# Patient Record
Sex: Male | Born: 1960
Health system: Southern US, Community
[De-identification: ages and names within clinical notes are randomized; demographics above are authoritative.]

## PROBLEM LIST (undated history)

## (undated) DIAGNOSIS — M1990 Unspecified osteoarthritis, unspecified site: Secondary | ICD-10-CM

## (undated) DIAGNOSIS — K219 Gastro-esophageal reflux disease without esophagitis: Secondary | ICD-10-CM

## (undated) DIAGNOSIS — K759 Inflammatory liver disease, unspecified: Secondary | ICD-10-CM

## (undated) DIAGNOSIS — E119 Type 2 diabetes mellitus without complications: Secondary | ICD-10-CM

## (undated) DIAGNOSIS — Z87442 Personal history of urinary calculi: Secondary | ICD-10-CM

## (undated) DIAGNOSIS — I499 Cardiac arrhythmia, unspecified: Secondary | ICD-10-CM

## (undated) HISTORY — PX: LITHOTRIPSY: SUR834

## (undated) HISTORY — PX: KIDNEY STONE SURGERY: SHX686

## (undated) HISTORY — PX: HERNIA REPAIR: SHX51

---

## 1998-12-01 ENCOUNTER — Encounter: Admission: RE | Admit: 1998-12-01 | Discharge: 1998-12-29 | Payer: Self-pay | Admitting: Family Medicine

## 2007-09-20 DIAGNOSIS — K759 Inflammatory liver disease, unspecified: Secondary | ICD-10-CM

## 2007-09-20 HISTORY — DX: Inflammatory liver disease, unspecified: K75.9

## 2013-06-04 ENCOUNTER — Other Ambulatory Visit: Payer: Self-pay | Admitting: Urology

## 2013-06-14 NOTE — Patient Instructions (Addendum)
21 Jerome Flores  06/14/2013   Your procedure is scheduled on: 06/28/13  Report to [name] at 5:15 AM.  Call this number if you have problems the morning of surgery [phone]: [phone]   Remember:   Do not eat food or drink liquids After Midnight.     Do not wear jewelry, make-up or nail polish.  Do not wear lotions, powders, or perfumes. You may wear deodorant.  Do not shave 48 hours prior to surgery. Men may shave face and neck.  Do not bring valuables to the hospital.  Contacts, dentures or bridgework may not be worn into surgery.  Leave suitcase in the car. After surgery it may be brought to your room.  For patients admitted to the hospital, checkout time is 11:00 AM the day of discharge.   Birdie Sons, RN  pre op nurse call if needed [phone]    FAILURE TO FOLLOW THESE INSTRUCTIONS MAY RESULT IN CANCELLATION OF YOUR SURGERY   Patient Signature: ___________________________________________

## 2013-06-17 ENCOUNTER — Encounter (HOSPITAL_COMMUNITY): Payer: Self-pay | Admitting: Pharmacy Technician

## 2013-06-17 ENCOUNTER — Encounter (HOSPITAL_COMMUNITY): Payer: Self-pay

## 2013-06-17 ENCOUNTER — Ambulatory Visit (HOSPITAL_COMMUNITY)
Admission: RE | Admit: 2013-06-17 | Discharge: 2013-06-17 | Disposition: A | Discharge status: Home / Self Care | Payer: Commercial Managed Care - PPO | Source: Ambulatory Visit | Attending: Urology | Admitting: Urology

## 2013-06-17 ENCOUNTER — Encounter (HOSPITAL_COMMUNITY)
Admission: RE | Admit: 2013-06-17 | Discharge: 2013-06-17 | Disposition: A | Discharge status: Home / Self Care | Payer: Commercial Managed Care - PPO | Source: Ambulatory Visit | Attending: Urology | Admitting: Urology

## 2013-06-17 DIAGNOSIS — Z01812 Encounter for preprocedural laboratory examination: Secondary | ICD-10-CM | POA: Insufficient documentation

## 2013-06-17 DIAGNOSIS — Z01810 Encounter for preprocedural cardiovascular examination: Secondary | ICD-10-CM | POA: Insufficient documentation

## 2013-06-17 DIAGNOSIS — Z01818 Encounter for other preprocedural examination: Secondary | ICD-10-CM | POA: Insufficient documentation

## 2013-06-17 DIAGNOSIS — E119 Type 2 diabetes mellitus without complications: Secondary | ICD-10-CM | POA: Insufficient documentation

## 2013-06-17 HISTORY — DX: Type 2 diabetes mellitus without complications: E11.9

## 2013-06-17 HISTORY — DX: Personal history of urinary calculi: Z87.442

## 2013-06-17 HISTORY — DX: Gastro-esophageal reflux disease without esophagitis: K21.9

## 2013-06-17 HISTORY — DX: Inflammatory liver disease, unspecified: K75.9

## 2013-06-17 HISTORY — DX: Unspecified osteoarthritis, unspecified site: M19.90

## 2013-06-17 LAB — BASIC METABOLIC PANEL
CO2: 26 mEq/L (ref 19–32)
Chloride: 101 mEq/L (ref 96–112)
GFR calc non Af Amer: >90 mL/min (ref >90)
Glucose, Bld: 106 mg/dL — ABNORMAL HIGH (ref 70–99)
Potassium: 5.1 mEq/L (ref 3.5–5.1)
Sodium: 137 mEq/L (ref 135–145)

## 2013-06-17 LAB — CBC
HCT: 43.8 % (ref 39.0–52.0)
Hemoglobin: 15.3 g/dL (ref 13.0–17.0)
MCH: 33.5 pg (ref 26.0–34.0)
Platelets: 178 10*3/uL (ref 150–400)
RBC: 4.57 MIL/uL (ref 4.22–5.81)

## 2013-06-17 NOTE — Progress Notes (Signed)
06/17/13 4540  OBSTRUCTIVE SLEEP APNEA  Have you ever been diagnosed with sleep apnea through a sleep study? No  Do you snore loudly (loud enough to be heard through closed doors)?  1  Do you often feel tired, fatigued, or sleepy during the daytime? 0  Has anyone observed you stop breathing during your sleep? 1  Do you have, or are you being treated for high blood pressure? 0  BMI more than 35 kg/m2? 0  Age over 51 years old? 1  Neck circumference greater than 40 cm/18 inches? 0  Gender: 1  Obstructive Sleep Apnea Score 4  Score 4 or greater  Results sent to PCP

## 2013-06-27 NOTE — Anesthesia Preprocedure Evaluation (Addendum)
Anesthesia Evaluation  Patient identified by MRN, date of birth, ID band Patient awake    Reviewed: Allergy & Precautions, H&P , NPO status , Patient's Chart, lab work & pertinent test results  Airway Mallampati: II TM Distance: <3 FB Neck ROM: Full    Dental no notable dental hx.    Pulmonary Current Smoker,  breath sounds clear to auscultation  Pulmonary exam normal       Cardiovascular negative cardio ROS  Rhythm:Regular Rate:Normal     Neuro/Psych negative neurological ROS  negative psych ROS   GI/Hepatic Neg liver ROS, GERD-  ,  Endo/Other  diabetes, Oral Hypoglycemic Agents  Renal/GU negative Renal ROS  negative genitourinary   Musculoskeletal negative musculoskeletal ROS (+)   Abdominal   Peds negative pediatric ROS (+)  Hematology negative hematology ROS (+)   Anesthesia Other Findings   Reproductive/Obstetrics negative OB ROS                          Anesthesia Physical Anesthesia Plan  ASA: II  Anesthesia Plan: General   Post-op Pain Management:    Induction: Intravenous  Airway Management Planned: Oral ETT  Additional Equipment:   Intra-op Plan:   Post-operative Plan: Extubation in OR  Informed Consent: I have reviewed the patients History and Physical, chart, labs and discussed the procedure including the risks, benefits and alternatives for the proposed anesthesia with the patient or authorized representative who has indicated his/her understanding and acceptance.   Dental advisory given  Plan Discussed with: CRNA and Surgeon  Anesthesia Plan Comments:         Anesthesia Quick Evaluation

## 2013-06-27 NOTE — Progress Notes (Signed)
Dr. Marlou Porch aware of patient's chest x-ray results per Lossie Faes.

## 2013-06-28 ENCOUNTER — Encounter (HOSPITAL_COMMUNITY): Payer: Self-pay | Admitting: *Deleted

## 2013-06-28 ENCOUNTER — Ambulatory Visit (HOSPITAL_COMMUNITY): Payer: Commercial Managed Care - PPO | Admitting: Anesthesiology

## 2013-06-28 ENCOUNTER — Encounter (HOSPITAL_COMMUNITY)
Admission: RE | Disposition: A | Discharge status: Home / Self Care | Payer: Self-pay | Source: Ambulatory Visit | Attending: Urology

## 2013-06-28 ENCOUNTER — Ambulatory Visit (HOSPITAL_COMMUNITY): Payer: Commercial Managed Care - PPO

## 2013-06-28 ENCOUNTER — Encounter (HOSPITAL_COMMUNITY): Payer: Commercial Managed Care - PPO | Admitting: Anesthesiology

## 2013-06-28 ENCOUNTER — Ambulatory Visit (HOSPITAL_COMMUNITY)
Admission: RE | Admit: 2013-06-28 | Discharge: 2013-06-29 | Disposition: A | Discharge status: Home / Self Care | Payer: Commercial Managed Care - PPO | Source: Ambulatory Visit | Attending: Urology | Admitting: Urology

## 2013-06-28 DIAGNOSIS — G4730 Sleep apnea, unspecified: Secondary | ICD-10-CM | POA: Insufficient documentation

## 2013-06-28 DIAGNOSIS — Z79899 Other long term (current) drug therapy: Secondary | ICD-10-CM | POA: Insufficient documentation

## 2013-06-28 DIAGNOSIS — N200 Calculus of kidney: Secondary | ICD-10-CM | POA: Insufficient documentation

## 2013-06-28 DIAGNOSIS — J4489 Other specified chronic obstructive pulmonary disease: Secondary | ICD-10-CM | POA: Insufficient documentation

## 2013-06-28 DIAGNOSIS — R9389 Abnormal findings on diagnostic imaging of other specified body structures: Secondary | ICD-10-CM | POA: Insufficient documentation

## 2013-06-28 DIAGNOSIS — K769 Liver disease, unspecified: Secondary | ICD-10-CM | POA: Insufficient documentation

## 2013-06-28 DIAGNOSIS — J449 Chronic obstructive pulmonary disease, unspecified: Secondary | ICD-10-CM | POA: Insufficient documentation

## 2013-06-28 DIAGNOSIS — K759 Inflammatory liver disease, unspecified: Secondary | ICD-10-CM | POA: Insufficient documentation

## 2013-06-28 DIAGNOSIS — N201 Calculus of ureter: Secondary | ICD-10-CM | POA: Insufficient documentation

## 2013-06-28 DIAGNOSIS — N135 Crossing vessel and stricture of ureter without hydronephrosis: Secondary | ICD-10-CM | POA: Insufficient documentation

## 2013-06-28 DIAGNOSIS — E7800 Pure hypercholesterolemia, unspecified: Secondary | ICD-10-CM | POA: Insufficient documentation

## 2013-06-28 DIAGNOSIS — E119 Type 2 diabetes mellitus without complications: Secondary | ICD-10-CM | POA: Insufficient documentation

## 2013-06-28 HISTORY — PX: NEPHROLITHOTOMY: SHX5134

## 2013-06-28 LAB — GLUCOSE, CAPILLARY: Glucose-Capillary: 187 mg/dL — ABNORMAL HIGH (ref 70–99)

## 2013-06-28 SURGERY — NEPHROLITHOTOMY PERCUTANEOUS
Anesthesia: General | Laterality: Left | Wound class: Clean Contaminated

## 2013-06-28 MED ORDER — SODIUM CHLORIDE 0.9 % IR SOLN
Status: DC | PRN
  Administered 2013-06-28: 28000 mL

## 2013-06-28 MED ORDER — CIPROFLOXACIN IN D5W 400 MG/200ML IV SOLN
INTRAVENOUS | Status: AC
  Filled 2013-06-28: qty 200

## 2013-06-28 MED ORDER — HYDROMORPHONE HCL PF 1 MG/ML IJ SOLN
INTRAMUSCULAR | Status: AC
  Filled 2013-06-28: qty 1

## 2013-06-28 MED ORDER — BUPIVACAINE-EPINEPHRINE PF 0.25-1:200000 % IJ SOLN
INTRAMUSCULAR | Status: AC
  Filled 2013-06-28: qty 30

## 2013-06-28 MED ORDER — SENNOSIDES-DOCUSATE SODIUM 8.6-50 MG PO TABS
2.0000 | ORAL_TABLET | Freq: Every day | ORAL | Status: DC
  Administered 2013-06-28: 2 via ORAL
  Filled 2013-06-28 (×2): qty 2

## 2013-06-28 MED ORDER — PROPOFOL 10 MG/ML IV BOLUS
INTRAVENOUS | Status: DC | PRN
  Administered 2013-06-28: 200 mg via INTRAVENOUS

## 2013-06-28 MED ORDER — CEFAZOLIN SODIUM-DEXTROSE 2-3 GM-% IV SOLR
2.0000 g | Freq: Once | INTRAVENOUS | Status: AC
  Administered 2013-06-28: 2 g via INTRAVENOUS
  Filled 2013-06-28: qty 50

## 2013-06-28 MED ORDER — ONDANSETRON HCL 4 MG PO TABS: 4.0000 mg | ORAL_TABLET | Freq: Three times a day (TID) | ORAL | Status: DC | PRN

## 2013-06-28 MED ORDER — ROCURONIUM BROMIDE 100 MG/10ML IV SOLN
INTRAVENOUS | Status: DC | PRN
  Administered 2013-06-28 (×4): 10 mg via INTRAVENOUS
  Administered 2013-06-28: 30 mg via INTRAVENOUS
  Administered 2013-06-28: 10 mg via INTRAVENOUS

## 2013-06-28 MED ORDER — BACITRACIN-NEOMYCIN-POLYMYXIN 400-5-5000 EX OINT: 1.0000 "application " | TOPICAL_OINTMENT | Freq: Three times a day (TID) | CUTANEOUS | Status: DC | PRN

## 2013-06-28 MED ORDER — LIDOCAINE HCL (PF) 2 % IJ SOLN
INTRAMUSCULAR | Status: DC | PRN
  Administered 2013-06-28: 40 mg

## 2013-06-28 MED ORDER — DIPHENHYDRAMINE HCL 12.5 MG/5ML PO ELIX: 12.5000 mg | ORAL_SOLUTION | Freq: Four times a day (QID) | ORAL | Status: DC | PRN

## 2013-06-28 MED ORDER — SODIUM CHLORIDE 0.9 % IV SOLN: 250.0000 mL | INTRAVENOUS | Status: DC | PRN

## 2013-06-28 MED ORDER — SODIUM CHLORIDE 0.9 % IJ SOLN: 3.0000 mL | INTRAMUSCULAR | Status: DC | PRN

## 2013-06-28 MED ORDER — CIPROFLOXACIN IN D5W 400 MG/200ML IV SOLN
400.0000 mg | INTRAVENOUS | Status: AC
  Administered 2013-06-28: 400 mg via INTRAVENOUS

## 2013-06-28 MED ORDER — PROMETHAZINE HCL 25 MG/ML IJ SOLN
INTRAMUSCULAR | Status: AC
  Filled 2013-06-28: qty 1

## 2013-06-28 MED ORDER — HYDROMORPHONE HCL PF 1 MG/ML IJ SOLN
0.2500 mg | INTRAMUSCULAR | Status: DC | PRN
  Administered 2013-06-28 (×2): 0.5 mg via INTRAVENOUS

## 2013-06-28 MED ORDER — NEOSTIGMINE METHYLSULFATE 1 MG/ML IJ SOLN
INTRAMUSCULAR | Status: DC | PRN
  Administered 2013-06-28: 4 mg via INTRAVENOUS

## 2013-06-28 MED ORDER — SUCCINYLCHOLINE CHLORIDE 20 MG/ML IJ SOLN
INTRAMUSCULAR | Status: DC | PRN
  Administered 2013-06-28: 100 mg via INTRAVENOUS

## 2013-06-28 MED ORDER — SODIUM CHLORIDE 0.9 % IJ SOLN: 3.0000 mL | Freq: Two times a day (BID) | INTRAMUSCULAR | Status: DC

## 2013-06-28 MED ORDER — ADULT MULTIVITAMIN W/MINERALS CH
1.0000 | ORAL_TABLET | Freq: Every day | ORAL | Status: DC
  Administered 2013-06-28 – 2013-06-29 (×2): 1 via ORAL
  Filled 2013-06-28 (×2): qty 1

## 2013-06-28 MED ORDER — LACTATED RINGERS IV SOLN
INTRAVENOUS | Status: DC | PRN
  Administered 2013-06-28 (×2): via INTRAVENOUS

## 2013-06-28 MED ORDER — DIPHENHYDRAMINE HCL 50 MG/ML IJ SOLN: 12.5000 mg | Freq: Four times a day (QID) | INTRAMUSCULAR | Status: DC | PRN

## 2013-06-28 MED ORDER — BUPIVACAINE-EPINEPHRINE PF 0.25-1:200000 % IJ SOLN
INTRAMUSCULAR | Status: DC | PRN
  Administered 2013-06-28: 10 mL

## 2013-06-28 MED ORDER — CEFAZOLIN SODIUM-DEXTROSE 2-3 GM-% IV SOLR
INTRAVENOUS | Status: AC
  Filled 2013-06-28: qty 50

## 2013-06-28 MED ORDER — MORPHINE SULFATE 2 MG/ML IJ SOLN
2.0000 mg | INTRAMUSCULAR | Status: DC | PRN
  Administered 2013-06-29: 2 mg via INTRAVENOUS
  Filled 2013-06-28: qty 1

## 2013-06-28 MED ORDER — PROMETHAZINE HCL 25 MG/ML IJ SOLN
6.2500 mg | INTRAMUSCULAR | Status: DC | PRN
  Administered 2013-06-28: 6.25 mg via INTRAVENOUS

## 2013-06-28 MED ORDER — MIDAZOLAM HCL 5 MG/5ML IJ SOLN
INTRAMUSCULAR | Status: DC | PRN
  Administered 2013-06-28: 2 mg via INTRAVENOUS

## 2013-06-28 MED ORDER — GLIPIZIDE 5 MG PO TABS
5.0000 mg | ORAL_TABLET | Freq: Two times a day (BID) | ORAL | Status: DC
  Administered 2013-06-28 – 2013-06-29 (×2): 5 mg via ORAL
  Filled 2013-06-28 (×4): qty 1

## 2013-06-28 MED ORDER — GLYCOPYRROLATE 0.2 MG/ML IJ SOLN
INTRAMUSCULAR | Status: DC | PRN
  Administered 2013-06-28: 0.6 mg via INTRAVENOUS

## 2013-06-28 MED ORDER — IOHEXOL 300 MG/ML  SOLN
INTRAMUSCULAR | Status: DC | PRN
  Administered 2013-06-28: 125 mL via URETHRAL

## 2013-06-28 MED ORDER — METFORMIN HCL 500 MG PO TABS
1000.0000 mg | ORAL_TABLET | Freq: Two times a day (BID) | ORAL | Status: DC
  Administered 2013-06-28 – 2013-06-29 (×2): 1000 mg via ORAL
  Filled 2013-06-28 (×4): qty 2

## 2013-06-28 MED ORDER — CIPROFLOXACIN HCL 500 MG PO TABS
500.0000 mg | ORAL_TABLET | Freq: Once | ORAL | Status: AC
  Administered 2013-06-28: 20:00:00 500 mg via ORAL
  Filled 2013-06-28: qty 1

## 2013-06-28 MED ORDER — FENTANYL CITRATE 0.05 MG/ML IJ SOLN
INTRAMUSCULAR | Status: DC | PRN
  Administered 2013-06-28 (×2): 50 ug via INTRAVENOUS
  Administered 2013-06-28: 100 ug via INTRAVENOUS
  Administered 2013-06-28 (×3): 50 ug via INTRAVENOUS

## 2013-06-28 MED ORDER — CEFAZOLIN SODIUM-DEXTROSE 2-3 GM-% IV SOLR
2.0000 g | INTRAVENOUS | Status: AC
  Administered 2013-06-28: 2 g via INTRAVENOUS

## 2013-06-28 MED ORDER — GLIPIZIDE-METFORMIN HCL 2.5-500 MG PO TABS: 2.0000 | ORAL_TABLET | Freq: Two times a day (BID) | ORAL | Status: DC

## 2013-06-28 MED ORDER — SODIUM CHLORIDE 0.9 % IV SOLN
INTRAVENOUS | Status: DC
  Administered 2013-06-28 – 2013-06-29 (×3): via INTRAVENOUS

## 2013-06-28 MED ORDER — LACTATED RINGERS IV SOLN: INTRAVENOUS | Status: DC

## 2013-06-28 MED ORDER — ONDANSETRON HCL 4 MG/2ML IJ SOLN
INTRAMUSCULAR | Status: DC | PRN
  Administered 2013-06-28: 4 mg via INTRAMUSCULAR

## 2013-06-28 MED ORDER — OXYCODONE HCL 5 MG PO TABS
5.0000 mg | ORAL_TABLET | ORAL | Status: DC | PRN
  Administered 2013-06-28 (×2): 5 mg via ORAL
  Administered 2013-06-29: 10 mg via ORAL
  Filled 2013-06-28: qty 2
  Filled 2013-06-28 (×2): qty 1

## 2013-06-28 MED ORDER — ACETAMINOPHEN 500 MG PO TABS
1000.0000 mg | ORAL_TABLET | Freq: Four times a day (QID) | ORAL | Status: DC | PRN
  Filled 2013-06-28: qty 2

## 2013-06-28 MED ORDER — OXYCODONE HCL 5 MG PO TABS: 5.0000 mg | ORAL_TABLET | ORAL | Status: DC | PRN

## 2013-06-28 SURGICAL SUPPLY — 60 items
APL SKNCLS STERI-STRIP NONHPOA (GAUZE/BANDAGES/DRESSINGS) ×1
BAG URINE DRAINAGE (UROLOGICAL SUPPLIES) ×4 IMPLANT
BASKET ZERO TIP NITINOL 2.4FR (BASKET) ×3 IMPLANT
BENZOIN TINCTURE PRP APPL 2/3 (GAUZE/BANDAGES/DRESSINGS) ×3 IMPLANT
BLADE SURG 15 STRL LF DISP TIS (BLADE) ×1 IMPLANT
BLADE SURG 15 STRL SS (BLADE) ×2
BSKT STON RTRVL ZERO TP 2.4FR (BASKET) ×2
CATCHER STONE W/TUBE ADAPTER (UROLOGICAL SUPPLIES) ×1 IMPLANT
CATH AINSWORTH 30CC 24FR (CATHETERS) ×2 IMPLANT
CATH BEACON 5.038 65CM KMP-01 (CATHETERS) ×2 IMPLANT
CATH FOLEY 2W COUNCIL 20FR 5CC (CATHETERS) IMPLANT
CATH FOLEY 2WAY SLVR  5CC 16FR (CATHETERS) ×1
CATH FOLEY 2WAY SLVR 5CC 16FR (CATHETERS) ×1 IMPLANT
CATH INTERMIT  6FR 70CM (CATHETERS) ×1 IMPLANT
CATH ROBINSON RED A/P 20FR (CATHETERS) IMPLANT
CATH URET 5FR 28IN OPEN ENDED (CATHETERS) ×2 IMPLANT
CATH X-FORCE N30 NEPHROSTOMY (TUBING) ×2 IMPLANT
CHLORAPREP W/TINT 26ML (MISCELLANEOUS) ×3 IMPLANT
CLOTH BEACON ORANGE TIMEOUT ST (SAFETY) ×1 IMPLANT
COVER SURGICAL LIGHT HANDLE (MISCELLANEOUS) ×2 IMPLANT
DRAPE C-ARM 42X120 X-RAY (DRAPES) ×2 IMPLANT
DRAPE CAMERA CLOSED 9X96 (DRAPES) ×4 IMPLANT
DRAPE LG THREE QUARTER DISP (DRAPES) ×2 IMPLANT
DRAPE LINGEMAN PERC (DRAPES) ×2 IMPLANT
DRAPE SURG IRRIG POUCH 19X23 (DRAPES) ×2 IMPLANT
DRSG TEGADERM 8X12 (GAUZE/BANDAGES/DRESSINGS) ×4 IMPLANT
FIBER LASER FLEXIVA 365 (UROLOGICAL SUPPLIES) IMPLANT
FIBER LASER FLEXIVA 550 (UROLOGICAL SUPPLIES) IMPLANT
GLOVE BIOGEL M STRL SZ7.5 (GLOVE) ×8 IMPLANT
GOWN STRL REIN XL XLG (GOWN DISPOSABLE) ×5 IMPLANT
GUIDEWIRE AMPLAZ .035X145 (WIRE) ×4 IMPLANT
GUIDEWIRE ANG ZIPWIRE 038X150 (WIRE) ×3 IMPLANT
GUIDEWIRE STR DUAL SENSOR (WIRE) ×1 IMPLANT
KIT BASIN OR (CUSTOM PROCEDURE TRAY) ×2 IMPLANT
MANIFOLD NEPTUNE II (INSTRUMENTS) ×2 IMPLANT
NDL TROCAR 18X15 ECHO (NEEDLE) IMPLANT
NDL TROCAR 18X20 (NEEDLE) IMPLANT
NEEDLE TROCAR 18X15 ECHO (NEEDLE) IMPLANT
NEEDLE TROCAR 18X20 (NEEDLE) ×2 IMPLANT
NS IRRIG 1000ML POUR BTL (IV SOLUTION) ×2 IMPLANT
PACK BASIC VI WITH GOWN DISP (CUSTOM PROCEDURE TRAY) ×2 IMPLANT
PACK CYSTO (CUSTOM PROCEDURE TRAY) ×2 IMPLANT
PAD ABD 7.5X8 STRL (GAUZE/BANDAGES/DRESSINGS) ×4 IMPLANT
PROBE LITHOCLAST ULTRA 3.8X403 (UROLOGICAL SUPPLIES) ×1 IMPLANT
PROBE PNEUMATIC 1.0MMX570MM (UROLOGICAL SUPPLIES) ×2 IMPLANT
SET IRRIG Y TYPE TUR BLADDER L (SET/KITS/TRAYS/PACK) ×2 IMPLANT
SET WARMING FLUID IRRIGATION (MISCELLANEOUS) ×1 IMPLANT
SHEATH PEELAWAY SET 9 (SHEATH) ×1 IMPLANT
SPONGE GAUZE 4X4 12PLY (GAUZE/BANDAGES/DRESSINGS) ×2 IMPLANT
SPONGE LAP 4X18 X RAY DECT (DISPOSABLE) ×2 IMPLANT
STENT CONTOUR 6FRX28X.038 (STENTS) IMPLANT
STENT CONTOUR 7FR X 28 (STENTS) ×1 IMPLANT
STONE CATCHER W/TUBE ADAPTER (UROLOGICAL SUPPLIES) ×2 IMPLANT
SUT SILK 0 FSL (SUTURE) ×2 IMPLANT
SYR 20CC LL (SYRINGE) ×4 IMPLANT
SYRINGE 10CC LL (SYRINGE) ×2 IMPLANT
SYRINGE IRR TOOMEY STRL 70CC (SYRINGE) ×1 IMPLANT
TOWEL OR 17X26 10 PK STRL BLUE (TOWEL DISPOSABLE) ×2 IMPLANT
TUBING CONNECTING 10 (TUBING) ×4 IMPLANT
WATER STERILE IRR 1500ML POUR (IV SOLUTION) ×2 IMPLANT

## 2013-06-28 NOTE — Transfer of Care (Signed)
Immediate Anesthesia Transfer of Care Note  Patient: Jerome Flores  Procedure(s) Performed: Procedure(s) (LRB): NEPHROLITHOTOMY LEFT PERCUTANEOUS (Left)  Patient Location: PACU  Anesthesia Type: General  Level of Consciousness: sedated, patient cooperative and responds to stimulation  Airway & Oxygen Therapy: Patient Spontanous Breathing and Patient connected to face mask oxgen  Post-op Assessment: Report given to PACU RN and Post -op Vital signs reviewed and stable  Post vital signs: Reviewed and stable  Complications: No apparent anesthesia complications

## 2013-06-28 NOTE — Anesthesia Postprocedure Evaluation (Signed)
  Anesthesia Post-op Note  Patient: Jerome Flores  Procedure(s) Performed: Procedure(s) (LRB): NEPHROLITHOTOMY LEFT PERCUTANEOUS (Left)  Patient Location: PACU  Anesthesia Type: General  Level of Consciousness: awake and alert   Airway and Oxygen Therapy: Patient Spontanous Breathing  Post-op Pain: mild  Post-op Assessment: Post-op Vital signs reviewed, Patient's Cardiovascular Status Stable, Respiratory Function Stable, Patent Airway and No signs of Nausea or vomiting  Last Vitals:  Filed Vitals:   06/28/13 0533  BP: 120/68  Pulse: 77  Temp: 36.8 C  Resp: 18    Post-op Vital Signs: stable   Complications: No apparent anesthesia complications

## 2013-06-28 NOTE — Preoperative (Signed)
Beta Blockers   Reason not to administer Beta Blockers:Not Applicable 

## 2013-06-28 NOTE — Op Note (Signed)
Pre-operative diagnosis: left renal pelvis stones >2.0  Post-operative diagnosis: as above   Procedure performed: cystoscopy, left retrograde pyelogram with interpretation, left percutaneous renal access, left nephrolithotomy, left nephrostogram, left ureteral stent placement, left nephrostomy tube placement.   Surgeon: Dr. Crist Fat  Assistant: none  Anesthesia: General  Complications: None  Findings: 1. Large obstructing/impacted stone in proximal left ureter. 2. I injected contrast into the left proximal ureter and collecting system and performed a retrograde pyelogram. There was significant hydronephrosis proximal to the impacted stone which was noted by a filling defect within the proximal ureter.  3. Distal ureter stenotic from previous open ureterolithotomy  4. Post-procedure nephrostogram revealed no further filling defects and correct position of the tube.   EBL: Minimal  Specimens: stone from collecting system - taken to [facility] lab  Indication: Jerome Flores is a 52 y.o. patient with long history of stones, with large left sided stone burden, including left proximal partially obstructing/impacted ureteral stone. After reviewing the management options for treatment, he elected to proceed with the above surgical procedure(s). We have discussed the potential benefits and risks of the procedure, side effects of the proposed treatment, the likelihood of the patient achieving the goals of the procedure, and any potential problems that might occur during the procedure or recuperation. Informed consent has been obtained.   Description:  Consent was obtained in the preoperative holding area. The patient was then taken to the operating room he was intubated on the gurney and then flipped prone onto the split leg operating room table. 2 large jelly rolls were used to pack is shoulders and iliac crests bilaterally. He was then prepped and draped in the  routine sterile fashion same Betadine prep for the genitals and chlorhexidine for the left back. We then started with a flexible cystoscope and inserted into the patient's urethra and into the bladder under visual guidance. I then placed a 0.38 sensor wire into the patient's left ureteral orifice and into the left renal pelvis. This was somewhat difficult given the impacted stone but ultimately we were able to push beyond it. We then passed the 5 [place] open-ended ureteral catheter over the sensor wire and into the left renal pelvis. We then injected contrast and performed a retrograde pyelogram. There was significant hydronephrosis proximal to the impacted stone which was noted by a filling defect within the proximal ureter.   Then using the C-arm rotated at 25 and the bulls-eye technique with an 18-gauge coaxial needle the lower pole calyx was targeted. Then rotating the C-arm AP  the depth of our needle was noted to be within the calyx and the inner part of the coaxial needle was removed. Urine was noted to return. A curved tip Glidewire was then passed through the sheath of the coaxial needle and into the  renal collecting system. The wire was then  passed beyond the impacted proximal ureteral stone and into the bladder where it was coiled in the bladder  and the sheath of the needle was removed. An angiographic catheter was then passed over the wire  into the bladder. Once in the bladder  with the angiographic catheter wire  was removed. A Super Stiff wire was then passed into the angiographic catheter and the angiographic catheter removed. A 9 French dilator was then advanced over the Super Stiff wire and passed into the renal pelvis  across the UPJ. The inner part of this dilator was removed and the angled Glidewire was again passed  alongside the Super Stiff wire through the outer part of the dilator into the right ureter. The angiographic catheter again was passed  over theGlidewire and used to help guide  the wire into the bladder. Once the wire was in the bladder the angiographic catheter was advanced into the bladder and the wire was removed. A second Super Stiff wire was then passed to the angiographic catheter and angiographic catheter removed. The outer part of the sheath was then removed, establishing 2 superstiff wires through the lower pole calyx and into the bladder.   The 30 French NephroMax balloon was then passed over one of the Super Stiff wires and the tip guided down into the right lower pole calyx. The balloon was then inflated to approximately 10 atm, and once there was no waist noted under fluoroscopy the access sheath was advanced over the balloon. The balloon was then removed. The  safety wire was  then backloaded into the sheath and snapped to the drape.   Using the rigid nephroscope confirmed position within the lower pole of the  left  collecting system. Several large stones were identified and removed with the 2 prong grasper. Then using a flexible cystoscope to navigate the remaining calyces of the kidney and multiple smaller stone fragments encountered. Using the 0 tip basket these fragments were grabbed and removed. Contrast was injected through the cystoscope and the calyces systematically inspected under fluoroscopic guidance to ensure that all stone fragments had been removed.   Then using the flexible cystoscope the ureter was navigated in antegrade fashion. The large impacted stone was encountered in the proximal ureter. The stone was manipulated and turned using the basket and then ultimately I was able to remove the stone with the basket not having to use the laser.  All additional stone fragments were removed from the ureter using a 0 tipped basket. Once the ureter was clear the scope was advanced into the bladder with significant resistance secondary to the stricture  and a sensor wire was left in the bladder and the scope backed out over wire. The sensor wire was then  backloaded over the rigid nephroscope using the stent pusher and a 2 8 cm x  7  French double-J ureteral stent was passed over the sensor wire down into the bladder under fluoroscopic guidance. Once the stent was in the bladder the wire was gently pulled back and a nice curl noted in the bladder. The string was then removed from the stent and the wire completely removed from the collecting system. A nice curl on the proximal end of the stent was noted in the renal pelvis. The sheath was then backed out slowly to ensure that all calyces had been inspected and there was nothing behind the sheath.   A 24  [place] Ainsworth catheter was then passed over one of the Super Stiff wires through the sheath and into the renal pelvis. A 5 French open-ended ureteral  catheter  passed over the Super Stiff wire through the council tip catheter and down the ureter. The sheath was then backed out of the kidney and cut off the Nephrostomy  catheter. A nephrostogram was then performed confirming the position of our nephrostomy tube and reassuring that there were no longer any filling defects from the patient's symptoms. The tube was then secured to the patient's skin with 0 silk ties. 10 cc of local anesthesia was then injected into the patient's wound,  A. catheter bag was then attached and the tube was  padded and dressed. The patient was subsequently rolled over to the supine position and extubated. He was returned to the PACU in excellent condition. At the end of the case all lap and needle and sponges were accounted for. There are no perioperative complications.

## 2013-06-28 NOTE — H&P (Signed)
Active Problems Problems  1. Nephrolithiasis 592.0  History of Present Illness  Patient is referred by Dr. Jenita Seashore, M.D. for evaluation of hematuria.  52 year old gentleman who recently underwent a physical [employer] and found microscopic hematuria.  Patient denies any gross hematuria, lower urinary tract symptoms, abdominal pain/suprapubic pain or back pain.  There is a history of kidney stones, he has had stones at least 3 times.  The last time the patient underwent ureteroscopy with stent insertion in September 2012.  Patient states that in 2009 he had an open ureterolithotomy for a Steinstrasse resulting from shockwave lithotripsy.  He has not had any follow-up or imaging and 3 years and was told that he had 6 stones remaining in his left kidney.  He is interested in routine care of the stones being stone free.  Patient works as a [employer] architect.  Patient is a former smoker, smokes 1 cigar per day.   Past Medical History Problems  1. History of  Adult Sleep Apnea 780.57 2. History of  Arthritis V13.4 3. History of  Chronic Liver Disease 571.9 4. History of  Diabetes Mellitus 250.00 5. History of  Hepatitis 573.3 6. History of  Hypercholesterolemia 272.0  Surgical History Problems  1. History of  Lithotripsy 2. History of  Lithotripsy  Current Meds 1. Actos 15 MG Oral Tablet; Therapy: (Recorded:15Sep2014) to 2. GlipiZIDE-MetFORMIN HCl 2.5-500 MG Oral Tablet; Therapy: (Recorded:15Sep2014) to 3. Simvastatin 40 MG Oral Tablet; Therapy: (Recorded:15Sep2014) to  Family History Problems  1. Family history of  Acute Myocardial Infarction V17.3 2. Family history of  Death In The Family Father age 39 3. Family history of  Diabetes Mellitus V18.0 4. Family history of  Family Health Status Number Of Children 1 son 1 daughter  Social History Problems    Caffeine Use   Marital History - Currently Married   Occupation: truck Hospital doctor   Tobacco Use  161.0 smoked for 30 years Denied    History of  Alcohol Use  Review of Systems Genitourinary, constitutional, skin, eye, otolaryngeal, hematologic/lymphatic, cardiovascular, pulmonary, endocrine, musculoskeletal, gastrointestinal, neurological and psychiatric system(s) were reviewed and pertinent findings if present are noted.  Eyes: blurred vision.    Vitals Vital Signs [Data Includes: Last 1 Day]  15Sep2014 08:58AM  BMI Calculated: 29.69 BSA Calculated: 2.25 Height: 6 ft 1 in Weight: 224 lb  Blood Pressure: 128 / 76 Temperature: 99 F Heart Rate: 76  Physical Exam Constitutional: Well nourished and well developed . No acute distress.  ENT:. The ears and nose are normal in appearance.  Neck: The appearance of the neck is normal and no neck mass is present.  Pulmonary: No respiratory distress and normal respiratory rhythm and effort.  Neuro/Psych:. Mood and affect are appropriate.    Results/Data Urine [Data Includes: Last 1 Day]   15Sep2014  COLOR YELLOW   APPEARANCE CLEAR   SPECIFIC GRAVITY 1.010   pH 6.0   GLUCOSE NEG mg/dL  BILIRUBIN NEG   KETONE NEG mg/dL  BLOOD SMALL   PROTEIN NEG mg/dL  UROBILINOGEN 0.2 mg/dL  NITRITE NEG   LEUKOCYTE ESTERASE NEG   SQUAMOUS EPITHELIAL/HPF NONE SEEN   WBC 0-2 WBC/hpf  RBC 3-6 RBC/hpf  BACTERIA NONE SEEN   CRYSTALS Calcium Oxalate crystals noted   CASTS NONE SEEN    Procedure  Stone protocol CT scan was obtained on 06/03/2013: Independently reviewed the films which show a 1 cm stone in the UPJ of the left side with prominent extrarenal pelvis versus  mild hydronephrosis, and addition there are multiple stones in the lower pole of the left kidney the largest measuring approximately 8 mm. There are no stones within the ureter or the bladder.   Assessment  Left 1 cm UPJ stone likely partially obstructing with proximal hydro-, multiple smaller within the left lower pole.   Plan Health Maintenance (V70.0)  1. UA With REFLEX   Done: 15Sep2014 08:51AM Nephrolithiasis (592.0)  2. Follow-up Schedule Surgery Office  Follow-up  Requested for: 15Sep2014   I went over the treatment options for this size stone burden in detail with the patient and his wife.  In particular we discussed the role of shockwave lithotripsy for the UPJ stone followed by ureteroscopy for the small fragments.  We also discussed ureteroscopy, which would likely be a staged procedure and required at least 2 anesthesias.  Lastly we discussed PCNL.  I went over the risks and benefits of all these procedures in great detail.  Given the amount of stone and the patients desire to be stone free I counseled him that the best shot at achieving a stone free status with one anesthesia is with PCNL.  I went over the risks and benefits of this operation in detail.  The patient understands the risk of bleeding, infection, and potential loss of kidney or need for embolization.  He also understands the nature of the procedure and that we may need to do a second look.  He also understands that he will be in the hospital for 1 day and likely have a stent following this.  The patient will discuss all these options with his wife and will call to schedule his surgery.

## 2013-06-29 ENCOUNTER — Ambulatory Visit (HOSPITAL_COMMUNITY): Payer: Commercial Managed Care - PPO

## 2013-06-29 LAB — CBC
HCT: 36.7 % — ABNORMAL LOW (ref 39.0–52.0)
Hemoglobin: 12.2 g/dL — ABNORMAL LOW (ref 13.0–17.0)
MCV: 96.3 fL (ref 78.0–100.0)
Platelets: 134 10*3/uL — ABNORMAL LOW (ref 150–400)
RBC: 3.81 MIL/uL — ABNORMAL LOW (ref 4.22–5.81)
RDW: 14.1 % (ref 11.5–15.5)
WBC: 11.3 10*3/uL — ABNORMAL HIGH (ref 4.0–10.5)

## 2013-06-29 LAB — BASIC METABOLIC PANEL
BUN: 17 mg/dL (ref 6–23)
CO2: 29 mEq/L (ref 19–32)
Chloride: 102 mEq/L (ref 96–112)
Creatinine, Ser: 1.21 mg/dL (ref 0.50–1.35)
GFR calc non Af Amer: 68 mL/min — ABNORMAL LOW (ref >90)
Potassium: 4.4 mEq/L (ref 3.5–5.1)

## 2013-06-29 MED ORDER — OXYCODONE HCL 5 MG PO TABS: 5.0000 mg | ORAL_TABLET | ORAL | Status: DC | PRN

## 2013-06-29 MED ORDER — ACETAMINOPHEN 500 MG PO TABS: 1000.0000 mg | ORAL_TABLET | Freq: Four times a day (QID) | ORAL | Status: AC | PRN

## 2013-06-29 MED ORDER — TROSPIUM CHLORIDE ER 60 MG PO CP24: 60.0000 mg | ORAL_CAPSULE | Freq: Every day | ORAL | Status: DC

## 2013-06-29 NOTE — Discharge Summary (Addendum)
  Date of admission: 06/28/2013  Date of discharge: 06/29/2013  Admission diagnosis: Left kidney stones  Discharge diagnosis: as above, diabetes, COPD  Secondary diagnoses:  There are no active problems to display for this patient.   History and Physical: For full details, please see admission history and physical. Briefly, Jerome Flores is a 52 y.o. year old patient with large stone burden in the left kidney. After weighing the risks/benefits he decided to proceed with PCNL.   Hospital Course: Patient tolerated the procedure well.  He was then transferred to the floor after an uneventful PACU stay.  His hospital course was uncomplicated.  On POD#1  he had met discharge criteria: was eating a regular diet, was up and ambulating independently,  pain was well controlled, was voiding without a catheter, and was ready to for discharge.   Laboratory values:   Recent Labs  06/29/13 0515  WBC 11.3*  HGB 12.2*  HCT 36.7*    Recent Labs  06/29/13 0515  NA 136  K 4.4  CL 102  CO2 29  GLUCOSE 134*  BUN 17  CREATININE 1.21  CALCIUM 8.7   No results found for this basename: LABPT, INR,  in the last 72 hours No results found for this basename: LABURIN,  in the last 72 hours No results found for this or any previous visit.  Disposition: Home  Discharge instruction: The patient was instructed to be ambulatory but told to refrain from heavy lifting, strenuous activity, or driving.  Discharge medications:    Medication List         acetaminophen 500 MG tablet  Commonly known as:  TYLENOL  Take 2 tablets (1,000 mg total) by mouth every 6 (six) hours as needed.     glipiZIDE-metformin 2.5-500 MG per tablet  Commonly known as:  METAGLIP  Take 2 tablets by mouth 2 (two) times daily before a meal.     multivitamin with minerals Tabs tablet  Take 1 tablet by mouth daily.     oxyCODONE 5 MG immediate release tablet  Commonly known as:  Oxy IR/ROXICODONE  Take 1-2 tablets  (5-10 mg total) by mouth every 4 (four) hours as needed.     pioglitazone 15 MG tablet  Commonly known as:  ACTOS  Take 15 mg by mouth daily.     simvastatin 40 MG tablet  Commonly known as:  ZOCOR  Take 40 mg by mouth at bedtime.     Trospium Chloride 60 MG Cp24  Commonly known as:  SANCTURA XR  Take 1 capsule (60 mg total) by mouth daily.        Followup:  10-14 days with Jerome Flores for stent removal.

## 2013-07-01 ENCOUNTER — Encounter (HOSPITAL_COMMUNITY): Payer: Self-pay | Admitting: Urology

## 2014-01-28 DIAGNOSIS — E7800 Pure hypercholesterolemia, unspecified: Secondary | ICD-10-CM | POA: Insufficient documentation

## 2015-12-31 DIAGNOSIS — B351 Tinea unguium: Secondary | ICD-10-CM | POA: Insufficient documentation

## 2018-04-03 ENCOUNTER — Emergency Department (HOSPITAL_COMMUNITY): Payer: Commercial Managed Care - PPO

## 2018-04-03 ENCOUNTER — Observation Stay (HOSPITAL_COMMUNITY)
Admission: EM | Admit: 2018-04-03 | Discharge: 2018-04-04 | Disposition: A | Discharge status: Home / Self Care | Payer: Commercial Managed Care - PPO | Attending: Internal Medicine | Admitting: Internal Medicine

## 2018-04-03 ENCOUNTER — Encounter (HOSPITAL_COMMUNITY): Payer: Self-pay

## 2018-04-03 ENCOUNTER — Other Ambulatory Visit: Payer: Self-pay

## 2018-04-03 DIAGNOSIS — E785 Hyperlipidemia, unspecified: Secondary | ICD-10-CM | POA: Diagnosis not present

## 2018-04-03 DIAGNOSIS — E1165 Type 2 diabetes mellitus with hyperglycemia: Secondary | ICD-10-CM | POA: Diagnosis not present

## 2018-04-03 DIAGNOSIS — Z886 Allergy status to analgesic agent status: Secondary | ICD-10-CM | POA: Diagnosis not present

## 2018-04-03 DIAGNOSIS — R262 Difficulty in walking, not elsewhere classified: Secondary | ICD-10-CM | POA: Diagnosis not present

## 2018-04-03 DIAGNOSIS — M545 Low back pain: Secondary | ICD-10-CM | POA: Diagnosis not present

## 2018-04-03 DIAGNOSIS — Z7984 Long term (current) use of oral hypoglycemic drugs: Secondary | ICD-10-CM | POA: Diagnosis not present

## 2018-04-03 DIAGNOSIS — M4854XA Collapsed vertebra, not elsewhere classified, thoracic region, initial encounter for fracture: Secondary | ICD-10-CM | POA: Diagnosis not present

## 2018-04-03 DIAGNOSIS — I700 Atherosclerosis of aorta: Secondary | ICD-10-CM | POA: Insufficient documentation

## 2018-04-03 DIAGNOSIS — R4182 Altered mental status, unspecified: Secondary | ICD-10-CM

## 2018-04-03 DIAGNOSIS — E119 Type 2 diabetes mellitus without complications: Secondary | ICD-10-CM

## 2018-04-03 DIAGNOSIS — Z79899 Other long term (current) drug therapy: Secondary | ICD-10-CM | POA: Diagnosis not present

## 2018-04-03 DIAGNOSIS — I10 Essential (primary) hypertension: Secondary | ICD-10-CM

## 2018-04-03 DIAGNOSIS — N3281 Overactive bladder: Secondary | ICD-10-CM | POA: Diagnosis not present

## 2018-04-03 DIAGNOSIS — E669 Obesity, unspecified: Secondary | ICD-10-CM

## 2018-04-03 DIAGNOSIS — E1169 Type 2 diabetes mellitus with other specified complication: Secondary | ICD-10-CM

## 2018-04-03 DIAGNOSIS — N400 Enlarged prostate without lower urinary tract symptoms: Secondary | ICD-10-CM | POA: Insufficient documentation

## 2018-04-03 DIAGNOSIS — B0229 Other postherpetic nervous system involvement: Secondary | ICD-10-CM | POA: Insufficient documentation

## 2018-04-03 DIAGNOSIS — F17290 Nicotine dependence, other tobacco product, uncomplicated: Secondary | ICD-10-CM | POA: Diagnosis not present

## 2018-04-03 DIAGNOSIS — Z888 Allergy status to other drugs, medicaments and biological substances status: Secondary | ICD-10-CM | POA: Diagnosis not present

## 2018-04-03 DIAGNOSIS — R55 Syncope and collapse: Principal | ICD-10-CM | POA: Diagnosis present

## 2018-04-03 DIAGNOSIS — G4730 Sleep apnea, unspecified: Secondary | ICD-10-CM | POA: Insufficient documentation

## 2018-04-03 LAB — RAPID URINE DRUG SCREEN, HOSP PERFORMED
AMPHETAMINES: NOT DETECTED
Benzodiazepines: NOT DETECTED
Cocaine: NOT DETECTED
OPIATES: NOT DETECTED
Tetrahydrocannabinol: NOT DETECTED

## 2018-04-03 LAB — CBC WITH DIFFERENTIAL/PLATELET
Basophils Absolute: 0 10*3/uL (ref 0.0–0.1)
Basophils Relative: 0 %
EOS PCT: 0 %
Eosinophils Absolute: 0 10*3/uL (ref 0.0–0.7)
HCT: 45.8 % (ref 39.0–52.0)
Hemoglobin: 15.7 g/dL (ref 13.0–17.0)
LYMPHS PCT: 7 %
Lymphs Abs: 1 10*3/uL (ref 0.7–4.0)
MCH: 33 pg (ref 26.0–34.0)
MCHC: 34.3 g/dL (ref 30.0–36.0)
MCV: 96.2 fL (ref 78.0–100.0)
MONO ABS: 0.9 10*3/uL (ref 0.1–1.0)
Monocytes Relative: 6 %
NEUTROS ABS: 12.2 10*3/uL — AB (ref 1.7–7.7)
Neutrophils Relative %: 87 %
Platelets: 172 10*3/uL (ref 150–400)
RBC: 4.76 MIL/uL (ref 4.22–5.81)
RDW: 14.1 % (ref 11.5–15.5)
WBC: 14.1 10*3/uL — ABNORMAL HIGH (ref 4.0–10.5)

## 2018-04-03 LAB — I-STAT CHEM 8, ED
BUN: 21 mg/dL — ABNORMAL HIGH (ref 6–20)
CHLORIDE: 98 mmol/L (ref 98–111)
Calcium, Ion: 1.22 mmol/L (ref 1.15–1.40)
Creatinine, Ser: 1 mg/dL (ref 0.61–1.24)
GLUCOSE: 214 mg/dL — AB (ref 70–99)
HCT: 46 % (ref 39.0–52.0)
HEMOGLOBIN: 15.6 g/dL (ref 13.0–17.0)
POTASSIUM: 3.2 mmol/L — AB (ref 3.5–5.1)
Sodium: 136 mmol/L (ref 135–145)
TCO2: 21 mmol/L — ABNORMAL LOW (ref 22–32)

## 2018-04-03 LAB — COMPREHENSIVE METABOLIC PANEL
ALBUMIN: 4.1 g/dL (ref 3.5–5.0)
ALT: 25 U/L (ref 0–44)
ANION GAP: 13 (ref 5–15)
AST: 28 U/L (ref 15–41)
Alkaline Phosphatase: 54 U/L (ref 38–126)
BILIRUBIN TOTAL: 0.6 mg/dL (ref 0.3–1.2)
BUN: 23 mg/dL — AB (ref 6–20)
CO2: 24 mmol/L (ref 22–32)
Calcium: 9.6 mg/dL (ref 8.9–10.3)
Chloride: 100 mmol/L (ref 98–111)
Creatinine, Ser: 1.19 mg/dL (ref 0.61–1.24)
GFR calc Af Amer: >60 mL/min (ref >60)
GFR calc non Af Amer: >60 mL/min (ref >60)
GLUCOSE: 215 mg/dL — AB (ref 70–99)
POTASSIUM: 3.2 mmol/L — AB (ref 3.5–5.1)
SODIUM: 137 mmol/L (ref 135–145)
Total Protein: 7.7 g/dL (ref 6.5–8.1)

## 2018-04-03 LAB — URINALYSIS, ROUTINE W REFLEX MICROSCOPIC
BILIRUBIN URINE: NEGATIVE
Bacteria, UA: NONE SEEN
Glucose, UA: NEGATIVE mg/dL
HGB URINE DIPSTICK: NEGATIVE
Ketones, ur: NEGATIVE mg/dL
LEUKOCYTES UA: NEGATIVE
NITRITE: NEGATIVE
PH: 5 (ref 5.0–8.0)
Protein, ur: 100 mg/dL — AB
SPECIFIC GRAVITY, URINE: 1.011 (ref 1.005–1.030)

## 2018-04-03 LAB — I-STAT TROPONIN, ED: Troponin i, poc: 0.07 ng/mL (ref 0.00–0.08)

## 2018-04-03 LAB — PROTIME-INR
INR: 1.08
Prothrombin Time: 13.9 seconds (ref 11.4–15.2)

## 2018-04-03 LAB — LIPASE, BLOOD: LIPASE: 31 U/L (ref 11–51)

## 2018-04-03 LAB — GLUCOSE, CAPILLARY
GLUCOSE-CAPILLARY: 137 mg/dL — AB (ref 70–99)
GLUCOSE-CAPILLARY: 135 mg/dL — AB (ref 70–99)

## 2018-04-03 LAB — CK: Total CK: 265 U/L (ref 49–397)

## 2018-04-03 LAB — HEMOGLOBIN A1C
HEMOGLOBIN A1C: 7.1 % — AB (ref 4.8–5.6)
Mean Plasma Glucose: 157.07 mg/dL

## 2018-04-03 MED ORDER — HYDRALAZINE HCL 20 MG/ML IJ SOLN: 10.0000 mg | Freq: Four times a day (QID) | INTRAMUSCULAR | Status: DC | PRN

## 2018-04-03 MED ORDER — ONDANSETRON HCL 4 MG/2ML IJ SOLN
4.0000 mg | Freq: Four times a day (QID) | INTRAMUSCULAR | Status: DC | PRN
  Administered 2018-04-04: 4 mg via INTRAVENOUS
  Filled 2018-04-03: qty 2

## 2018-04-03 MED ORDER — IOPAMIDOL (ISOVUE-370) INJECTION 76%
100.0000 mL | Freq: Once | INTRAVENOUS | Status: AC | PRN
  Administered 2018-04-03: 100 mL via INTRAVENOUS

## 2018-04-03 MED ORDER — SENNOSIDES-DOCUSATE SODIUM 8.6-50 MG PO TABS
1.0000 | ORAL_TABLET | Freq: Two times a day (BID) | ORAL | Status: DC
  Administered 2018-04-03 – 2018-04-04 (×2): 1 via ORAL
  Filled 2018-04-03 (×2): qty 1

## 2018-04-03 MED ORDER — SIMVASTATIN 40 MG PO TABS
40.0000 mg | ORAL_TABLET | Freq: Every day | ORAL | Status: DC
  Filled 2018-04-03: qty 1

## 2018-04-03 MED ORDER — IOPAMIDOL (ISOVUE-370) INJECTION 76%
INTRAVENOUS | Status: AC
Start: 2018-04-03 — End: 2018-04-04
  Filled 2018-04-03: qty 100

## 2018-04-03 MED ORDER — INSULIN ASPART 100 UNIT/ML ~~LOC~~ SOLN
0.0000 [IU] | SUBCUTANEOUS | Status: DC
  Administered 2018-04-04: 2 [IU] via SUBCUTANEOUS
  Administered 2018-04-04: 3 [IU] via SUBCUTANEOUS
  Administered 2018-04-04: 2 [IU] via SUBCUTANEOUS
  Administered 2018-04-04: 1 [IU] via SUBCUTANEOUS

## 2018-04-03 MED ORDER — HYDROMORPHONE HCL 1 MG/ML IJ SOLN
0.5000 mg | INTRAMUSCULAR | Status: DC | PRN
  Administered 2018-04-03 – 2018-04-04 (×3): 0.5 mg via INTRAVENOUS
  Filled 2018-04-03 (×3): qty 0.5

## 2018-04-03 MED ORDER — TROSPIUM CHLORIDE ER 60 MG PO CP24: 60.0000 mg | ORAL_CAPSULE | Freq: Every day | ORAL | Status: DC

## 2018-04-03 MED ORDER — POLYETHYLENE GLYCOL 3350 17 G PO PACK
17.0000 g | PACK | Freq: Every day | ORAL | Status: DC
  Administered 2018-04-04: 17 g via ORAL
  Filled 2018-04-03: qty 1

## 2018-04-03 MED ORDER — OXYCODONE HCL ER 10 MG PO T12A
10.0000 mg | EXTENDED_RELEASE_TABLET | Freq: Two times a day (BID) | ORAL | Status: DC
  Administered 2018-04-03: 10 mg via ORAL
  Filled 2018-04-03: qty 1

## 2018-04-03 MED ORDER — ENOXAPARIN SODIUM 40 MG/0.4ML ~~LOC~~ SOLN
40.0000 mg | SUBCUTANEOUS | Status: DC
  Administered 2018-04-03: 40 mg via SUBCUTANEOUS
  Filled 2018-04-03 (×2): qty 0.4

## 2018-04-03 MED ORDER — ACETAMINOPHEN 325 MG PO TABS
650.0000 mg | ORAL_TABLET | Freq: Four times a day (QID) | ORAL | Status: DC | PRN
Start: 2018-04-03 — End: 2018-04-04

## 2018-04-03 MED ORDER — SODIUM CHLORIDE 0.9 % IV SOLN
INTRAVENOUS | Status: DC
  Administered 2018-04-03: 19:00:00 via INTRAVENOUS
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
## 2018-04-03 NOTE — Progress Notes (Signed)
Received pt from ED, VSs. Telemetry placed and confirmed. IV started. Pt c/o of back pain. Md notified, pain med requested and administered.ORtho notified concerning back brace. SRP,RN

## 2018-04-03 NOTE — ED Notes (Signed)
Bed: WA05 Expected date:  Expected time:  Means of arrival:  Comments: 
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
## 2018-04-04 ENCOUNTER — Inpatient Hospital Stay (HOSPITAL_COMMUNITY): Payer: Commercial Managed Care - PPO

## 2018-04-04 ENCOUNTER — Inpatient Hospital Stay (HOSPITAL_BASED_OUTPATIENT_CLINIC_OR_DEPARTMENT_OTHER): Payer: Commercial Managed Care - PPO

## 2018-04-04 DIAGNOSIS — R55 Syncope and collapse: Secondary | ICD-10-CM

## 2018-04-04 DIAGNOSIS — R4182 Altered mental status, unspecified: Secondary | ICD-10-CM | POA: Diagnosis not present

## 2018-04-04 LAB — CBC
HEMATOCRIT: 45.3 % (ref 39.0–52.0)
Hemoglobin: 15.3 g/dL (ref 13.0–17.0)
MCH: 32.8 pg (ref 26.0–34.0)
MCHC: 33.8 g/dL (ref 30.0–36.0)
MCV: 97.2 fL (ref 78.0–100.0)
Platelets: 166 10*3/uL (ref 150–400)
RBC: 4.66 MIL/uL (ref 4.22–5.81)
RDW: 14.4 % (ref 11.5–15.5)
WBC: 11.7 10*3/uL — AB (ref 4.0–10.5)

## 2018-04-04 LAB — BASIC METABOLIC PANEL
ANION GAP: 11 (ref 5–15)
BUN: 21 mg/dL — AB (ref 6–20)
CALCIUM: 8.9 mg/dL (ref 8.9–10.3)
CO2: 27 mmol/L (ref 22–32)
Chloride: 102 mmol/L (ref 98–111)
Creatinine, Ser: 1.1 mg/dL (ref 0.61–1.24)
GFR calc Af Amer: >60 mL/min (ref >60)
GFR calc non Af Amer: >60 mL/min (ref >60)
GLUCOSE: 171 mg/dL — AB (ref 70–99)
Potassium: 3.3 mmol/L — ABNORMAL LOW (ref 3.5–5.1)
Sodium: 140 mmol/L (ref 135–145)

## 2018-04-04 LAB — ECHOCARDIOGRAM COMPLETE
Height: 73 in
Weight: 3564.8 oz

## 2018-04-04 LAB — GLUCOSE, CAPILLARY
GLUCOSE-CAPILLARY: 145 mg/dL — AB (ref 70–99)
Glucose-Capillary: 151 mg/dL — ABNORMAL HIGH (ref 70–99)
Glucose-Capillary: 180 mg/dL — ABNORMAL HIGH (ref 70–99)
Glucose-Capillary: 230 mg/dL — ABNORMAL HIGH (ref 70–99)

## 2018-04-04 MED ORDER — METHOCARBAMOL 500 MG PO TABS
500.0000 mg | ORAL_TABLET | Freq: Three times a day (TID) | ORAL | Status: DC
  Administered 2018-04-04: 500 mg via ORAL
  Filled 2018-04-04: qty 1

## 2018-04-04 MED ORDER — IBUPROFEN 200 MG PO TABS
600.0000 mg | ORAL_TABLET | Freq: Once | ORAL | Status: AC
  Administered 2018-04-04: 600 mg via ORAL
  Filled 2018-04-04: qty 3

## 2018-04-04 MED ORDER — HYDROCODONE-ACETAMINOPHEN 5-325 MG PO TABS: 1.0000 | ORAL_TABLET | Freq: Four times a day (QID) | ORAL | 0 refills | Status: AC | PRN

## 2018-04-04 MED ORDER — NICOTINE 14 MG/24HR TD PT24
14.0000 mg | MEDICATED_PATCH | Freq: Every day | TRANSDERMAL | Status: DC
  Administered 2018-04-04: 14 mg via TRANSDERMAL
  Filled 2018-04-04: qty 1

## 2018-04-04 MED ORDER — KETOROLAC TROMETHAMINE 30 MG/ML IJ SOLN
30.0000 mg | Freq: Four times a day (QID) | INTRAMUSCULAR | Status: DC
  Administered 2018-04-04 (×2): 30 mg via INTRAVENOUS
  Filled 2018-04-04 (×2): qty 1

## 2018-04-04 MED ORDER — SENNOSIDES-DOCUSATE SODIUM 8.6-50 MG PO TABS: 1.0000 | ORAL_TABLET | Freq: Two times a day (BID) | ORAL | 0 refills | Status: AC

## 2018-04-04 MED ORDER — POLYETHYLENE GLYCOL 3350 17 G PO PACK: 17.0000 g | PACK | Freq: Every day | ORAL | 0 refills | Status: AC

## 2018-04-04 MED ORDER — METHOCARBAMOL 500 MG PO TABS: 500.0000 mg | ORAL_TABLET | Freq: Three times a day (TID) | ORAL | 0 refills | Status: AC

## 2018-04-04 MED ORDER — HYDROCODONE-ACETAMINOPHEN 5-325 MG PO TABS: 1.0000 | ORAL_TABLET | ORAL | Status: DC | PRN

## 2018-04-04 MED ORDER — POTASSIUM CHLORIDE CRYS ER 20 MEQ PO TBCR
40.0000 meq | EXTENDED_RELEASE_TABLET | Freq: Once | ORAL | Status: AC
Start: 2018-04-04 — End: 2018-04-04
  Administered 2018-04-04: 40 meq via ORAL
  Filled 2018-04-04: qty 2

## 2018-04-04 MED ORDER — CALCIUM CARBONATE-VITAMIN D 500-200 MG-UNIT PO TABS: 1.0000 | ORAL_TABLET | Freq: Two times a day (BID) | ORAL | 3 refills | Status: AC

## 2018-04-04 MED ORDER — NICOTINE 14 MG/24HR TD PT24: 14.0000 mg | MEDICATED_PATCH | Freq: Every day | TRANSDERMAL | 0 refills | Status: AC

## 2018-04-04 MED ORDER — LIDOCAINE 5 % EX PTCH: 1.0000 | MEDICATED_PATCH | CUTANEOUS | 0 refills | Status: AC

## 2018-04-04 NOTE — Evaluation (Signed)
Physical Therapy Evaluation Patient Details Name: Jerome Flores MRN: [id number] DOB: [birth year] Today's Date: 04/04/2018   History of Present Illness  57 yo male admitted with syncope, T12, L2, L3 vertebral fractures. Hx of DM, obesity.   Clinical Impression  On eval, pt required Min/Mod assist to don TLSO, Min assist to don underwear/shorts, Total assist to don socks, and Min assist for mobility. He walked ~150 feet with use of IV pole and hallway handrail. Pt c/o 8/10 pain in low back and bil calves (made MD aware). He required several standing rest breaks due to pain. Instructed pt to wear TLSO anytime he is OOB. Explained that he will likely have to readjust it is standing if he puts it on in sitting (as we did on today). Discussed d/c plan-pt plans to return home where he lives with his wife. Will recommend RW use for ambulation to assist with pain management.     Follow Up Recommendations Home health PT (if pt is agreeable);Supervision/Assistance - 24 hour    Equipment Recommendations  Rolling walker with 5" wheels    Recommendations for Other Services       Precautions / Restrictions Precautions Precautions: Fall Required Braces or Orthoses: Spinal Brace Spinal Brace: Thoracolumbosacral orthotic;Applied in sitting position Explained to pt that he may have to readjust in standing to ensure it's not too high up. Rep did not fit brace to pt so made some minor adjustments at pt's request. He c/o brace restricting his breathing. Restrictions Weight Bearing Restrictions: No      Mobility  Bed Mobility Overal bed mobility: Needs Assistance Bed Mobility: Sidelying to Sit Min Assist         General bed mobility comments: Assist for trunk to upright position. Increased time. Encouraged logroll and side to sit.   Transfers Overall transfer level: Needs assistance Equipment used: None;Rolling walker (2 wheeled) Transfers: Sit to/from Stand Sit to Stand: Min assist;From  elevated surface         General transfer comment: Assist to rise, stabilize, control descent. Increased time. VCs safety, hand placement   Ambulation/Gait Ambulation/Gait assistance: Min guard Gait Distance (Feet): 150 Feet Assistive device: None;IV Pole(hallway handrail)       General Gait Details: very close guarding. Began with RW but pt was picking it up instead of rolling it. Walked with IV pole/hallway handrail for remainder of distance. Several standing rest breaks taken due to pain.   Stairs            Wheelchair Mobility    Modified Rankin (Stroke Patients Only)       Balance Overall balance assessment: Needs assistance           Standing balance-Leahy Scale: Fair                               Pertinent Vitals/Pain Pain Assessment: 0-10 Pain Score: 8  Pain Location: back and bil calves with activity Pain Descriptors / Indicators: Sharp;Discomfort;Grimacing Pain Intervention(s): Monitored during session;Repositioned    Home Living Family/patient expects to be discharged to:: Private residence Living Arrangements: Spouse/significant other Available Help at Discharge: Family Type of Home: House Home Access: Stairs to enter   Secretary/administratorntrance Stairs-Number of Steps: 2 Home Layout: Able to live on main level with bedroom/bathroom Home Equipment: None      Prior Function Level of Independence: Independent               Hand  Dominance        Extremity/Trunk Assessment   Upper Extremity Assessment Upper Extremity Assessment: Defer to OT evaluation    Lower Extremity Assessment Lower Extremity Assessment: Generalized weakness    Cervical / Trunk Assessment Cervical / Trunk Assessment: Normal  Communication   Communication: No difficulties  Cognition Arousal/Alertness: Awake/alert Behavior During Therapy: WFL for tasks assessed/performed Overall Cognitive Status: Within Functional Limits for tasks assessed                                         General Comments      Exercises     Assessment/Plan    PT Assessment Patient needs continued PT services  PT Problem List Decreased balance;Decreased mobility;Decreased activity tolerance;Pain;Decreased knowledge of use of DME       PT Treatment Interventions DME instruction;Gait training;Functional mobility training;Therapeutic activities;Balance training;Patient/family education;Therapeutic exercise    PT Goals (Current goals can be found in the Care Plan section)  Acute Rehab PT Goals Patient Stated Goal: less pain. home.  PT Goal Formulation: With patient/family Time For Goal Achievement: 04/18/18 Potential to Achieve Goals: Good    Frequency Min 3X/week   Barriers to discharge        Co-evaluation               AM-PAC PT "6 Clicks" Daily Activity  Outcome Measure Difficulty turning over in bed (including adjusting bedclothes, sheets and blankets)?: A Lot Difficulty moving from lying on back to sitting on the side of the bed? : Unable Difficulty sitting down on and standing up from a chair with arms (e.g., wheelchair, bedside commode, etc,.)?: Unable Help needed moving to and from a bed to chair (including a wheelchair)?: A Little Help needed walking in hospital room?: A Little Help needed climbing 3-5 steps with a railing? : A Lot 6 Click Score: 12    End of Session   Activity Tolerance: Patient limited by pain Patient left: in chair;with call bell/phone within reach;with family/visitor present   PT Visit Diagnosis: Pain;Difficulty in walking, not elsewhere classified (R26.2) Pain - part of body: (back, bil legs)    Time: 1400-1431 PT Time Calculation (min) (ACUTE ONLY): 31 min   Charges:   PT Evaluation $PT Eval Moderate Complexity: 1 Mod PT Treatments $Gait Training: 8-22 mins   PT G Codes:          Rebeca Alert, MPT Pager: [phone]

## 2018-04-04 NOTE — Evaluation (Signed)
Occupational Therapy Evaluation Patient Details Name: Jerome Flores MRN: [id number] DOB: 04/14/1961 Today's Date: 04/04/2018    History of Present Illness 57 yo male admitted with syncope, T12, L2, L3 vertebral fractures. Hx of DM, obesity.    Clinical Impression   This 57 year old man was admitted for the above.  All education was completed. No further OT is needed at this time    Follow Up Recommendations  Supervision/Assistance - 24 hour    Equipment Recommendations  None recommended by OT    Recommendations for Other Services       Precautions / Restrictions Precautions Precautions: Fall Required Braces or Orthoses: Spinal Brace Spinal Brace: Thoracolumbosacral orthotic;Applied in sitting position Restrictions Weight Bearing Restrictions: No      Mobility Bed Mobility Overal bed mobility: Needs Assistance Bed Mobility: Sidelying to Sit           General bed mobility comments: pt did not want to practice. He states he performs sidelying<>sit anyway  Transfers Overall transfer level: Needs assistance Equipment used: None Transfers: Sit to/from Stand Sit to Stand: Min guard         General transfer comment: from chair with armrests    Balance Overall balance assessment: Needs assistance           Standing balance-Leahy Scale: Fair                             ADL either performed or assessed with clinical judgement   ADL Overall ADL's : Needs assistance/impaired Eating/Feeding: Independent   Grooming: Set up   Upper Body Bathing: Set up   Lower Body Bathing: Moderate assistance;Sit to/from stand   Upper Body Dressing : Set up   Lower Body Dressing: Maximal assistance;Sit to/from stand                 General ADL Comments: practiced donning brace:  pt needs mod A.  His mother plans to stay for a few days and will help.  Wife present:  in w/c up in room (borrowed) and on 02. Educated on performing adls without bending  and twisting. Mother/wife will assist as needed. Pt is not interested in AE.  He hopes he can use standard commode without 3:1 over it.  Educated on options for toilet hygiene, if he has difficulty     Vision         Perception     Praxis      Pertinent Vitals/Pain Pain Assessment: Faces Pain Score: 8  Faces Pain Scale: Hurts even more Pain Location: back with movement Pain Descriptors / Indicators: Sharp;Discomfort;Grimacing Pain Intervention(s): Limited activity within patient's tolerance;Monitored during session;Repositioned     Hand Dominance     Extremity/Trunk Assessment Upper Extremity Assessment Upper Extremity Assessment: Overall WFL for tasks assessed   Lower Extremity Assessment Lower Extremity Assessment: Generalized weakness   Cervical / Trunk Assessment Cervical / Trunk Assessment: Normal   Communication Communication Communication: No difficulties   Cognition Arousal/Alertness: Awake/alert Behavior During Therapy: WFL for tasks assessed/performed Overall Cognitive Status: Within Functional Limits for tasks assessed                                     General Comments       Exercises     Shoulder Instructions      Home Living Family/patient expects to be discharged  to:: Private residence Living Arrangements: Spouse/significant other Available Help at Discharge: Family Type of Home: House Home Access: Stairs to enter Secretary/administratorntrance Stairs-Number of Steps: 2   Home Layout: Able to live on main level with bedroom/bathroom     Bathroom Shower/Tub: Chief Strategy OfficerTub/shower unit   Bathroom Toilet: Standard     Home Equipment: Bedside commode;Tub bench   Additional Comments: pt has DME from when his mom stayed and had DME needs      Prior Functioning/Environment Level of Independence: Independent        Comments: truck driver        OT Problem List:        OT Treatment/Interventions:      OT Goals(Current goals can be found in the  care plan section) Acute Rehab OT Goals Patient Stated Goal: less pain. home.  OT Goal Formulation: All assessment and education complete, DC therapy  OT Frequency:     Barriers to D/C:            Co-evaluation              AM-PAC PT "6 Clicks" Daily Activity     Outcome Measure Help from another person eating meals?: None Help from another person taking care of personal grooming?: A Little Help from another person toileting, which includes using toliet, bedpan, or urinal?: A Little Help from another person bathing (including washing, rinsing, drying)?: A Lot Help from another person to put on and taking off regular upper body clothing?: A Little Help from another person to put on and taking off regular lower body clothing?: A Lot 6 Click Score: 17   End of Session    Activity Tolerance: Patient tolerated treatment well Patient left: in chair;with call bell/phone within reach;with family/visitor present  OT Visit Diagnosis: Muscle weakness (generalized) (M62.81)                Time: 1500-1520 OT Time Calculation (min): 20 min Charges:  OT General Charges $OT Visit: 1 Visit OT Evaluation $OT Eval Low Complexity: 1 Low G-Codes:     LahomaMaryellen Janelis Stelzer, OTR/L [phone] 04/04/2018  Jerome Flores 04/04/2018, 3:35 PM

## 2018-04-04 NOTE — Discharge Instructions (Signed)
Spinal Compression Fracture A spinal compression fracture is a collapse of the bones that form the spine (vertebrae). With this type of fracture, the vertebrae become squashed (compressed) into a wedge shape. Most compression fractures happen in the middle or lower part of the spine. What are the causes? This condition may be caused by:  Thinning and loss of density in the bones (osteoporosis). This is the most common cause.  A fall.  A car or motorcycle accident.  Cancer.  Trauma, such as a heavy, direct hit to the head.  What increases the risk? You may be at greater risk for a spinal compression fracture if you:  Are 50 years old or older.  Have osteoporosis.  Have certain types of cancer, including: ? Multiple myeloma. ? Lymphoma. ? Prostate cancer. ? Lung cancer. ? Breast cancer.  What are the signs or symptoms? Symptoms of this condition include:  Severe pain.  Pain that gets worse over time.  Pain that is worse when you stand, walk, sit, or bend.  Sudden pain that is so bad that it is hard for you to move.  Bending or humping of the spine.  Gradual loss of height.  Numbness, tingling, or weakness in the back and legs.  Trouble walking.  Your symptoms will depend on the cause of the fracture and how quickly it develops. For example, fractures that are caused by osteoporosis can cause few symptoms, no symptoms, or symptoms that develop slowly over time. How is this diagnosed? This condition may be diagnosed based on symptoms, medical history, and a physical exam. During the physical exam, your health care provider may tap along the length of your spine to check for tenderness. Tests may be done to confirm the diagnosis. They may include:  A bone density test to check for osteoporosis.  Imaging tests, such as a spine X-ray, a CT scan, or MRI.  How is this treated? Treatment for this condition depends on the cause and severity of the condition.Some  fractures, such as those that are caused by osteoporosis, may heal on their own with supportive care. This may include:  Pain medicine.  Rest.  A back brace.  Physical therapy exercises.  Medicine that reduces bone pain.  Calcium and vitamin D supplements.  Fractures that cause the back to become misshapen, cause nerve pain or weakness, or do not respond to other treatment may be treated with a surgical procedure, such as:  Vertebroplasty. In this procedure, bone cement is injected into the collapsed vertebrae to stabilize them.  Balloon kyphoplasty. In this procedure, the collapsed vertebrae are expanded with a balloon and then bone cement is injected into them.  Spinal fusion. In this procedure, the collapsed vertebrae are connected (fused) to normal vertebrae.  Follow these instructions at home: General instructions  Take medicines only as directed by your health care provider.  Do not drive or operate heavy machinery while taking pain medicine.  If directed, apply ice to the injured area: ? Put ice in a plastic bag. ? Place a towel between your skin and the bag. ? Leave the ice on for 30 minutes every two hours at first. Then apply the ice as needed.  Wear your neck brace or back brace as directed by your health care provider.  Do not drink alcohol. Alcohol can interfere with your treatment.  Keep all follow-up visits as directed by your health care provider. This is important. It can help to prevent permanent injury, disability, and long-lasting (chronic) pain.   Activity  Stay in bed (on bed rest) only as directed by your health care provider. Being on bed rest for too long can make your condition worse.  Return to your normal activities as directed by your health care provider. Ask what activities are safe for you.  Do exercises to improve motion and strength in your back (physical therapy), as recommended by your health care provider.  Exercise regularly as  directed by your health care provider. Contact a health care provider if:  You have a fever.  You develop a cough that makes your pain worse.  Your pain medicine is not helping.  Your pain does not get better over time.  You cannot return to your normal activities as planned or expected. Get help right away if:  Your pain is very bad and it suddenly gets worse.  You are unable to move any body part (paralysis) that is below the level of your injury.  You have numbness, tingling, or weakness in any body part that is below the level of your injury.  You cannot control your bladder or bowels. This information is not intended to replace advice given to you by your health care provider. Make sure you discuss any questions you have with your health care provider. Document Released: 09/05/2005 Document Revised: 05/03/2016 Document Reviewed: 09/09/2014 Elsevier Interactive Patient Education  [employer].

## 2018-04-04 NOTE — Progress Notes (Signed)
  Echocardiogram 2D Echocardiogram has been performed.  Roosvelt MaserLane, Alf Doyle F 04/04/2018, 10:19 AM

## 2018-04-04 NOTE — Progress Notes (Signed)
Pt declined HH at present time. Pt also states that this is Workers' comp. [facility]

## 2018-04-04 NOTE — Progress Notes (Addendum)
Pt discharged to home instructions given to pt and family. Pt has back brace in place, PT evaluated pt, Case manager followed, pt has walker, pt acknowledged understanding of his instructions and appointments. Prescriptions reviewed with patient. Pt place in wheelchair and transported to car. SRP, RN

## 2018-04-04 NOTE — Plan of Care (Signed)
Pt is anxious and still in a great deal of pain.

## 2018-04-04 NOTE — Progress Notes (Signed)
Received report back brace will be delivered this from outside venue. Called [employer], [employer] will deliver today. Will call [employer] @ [phone] to check status of TSO brace. SRP, RN

## 2018-04-04 NOTE — Progress Notes (Signed)
Carotid artery duplex has been completed. 1-39% ICA stenosis bilaterally.  04/04/18 12:04 PM Olen CordialGreg Cinde Ebert RVT
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
## 2018-11-06 DIAGNOSIS — I471 Supraventricular tachycardia, unspecified: Secondary | ICD-10-CM | POA: Insufficient documentation

## 2018-11-06 DIAGNOSIS — I459 Conduction disorder, unspecified: Secondary | ICD-10-CM | POA: Insufficient documentation
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
## 2019-02-22 DIAGNOSIS — F330 Major depressive disorder, recurrent, mild: Secondary | ICD-10-CM | POA: Insufficient documentation

## 2020-01-28 DIAGNOSIS — I10 Essential (primary) hypertension: Secondary | ICD-10-CM | POA: Insufficient documentation

## 2020-01-28 HISTORY — DX: Essential (primary) hypertension: I10

## 2020-09-22 ENCOUNTER — Other Ambulatory Visit (HOSPITAL_COMMUNITY)
Admission: RE | Admit: 2020-09-22 | Discharge: 2020-09-22 | Disposition: A | Discharge status: Home / Self Care | Payer: PPO | Source: Ambulatory Visit | Attending: Urology | Admitting: Urology

## 2020-09-22 ENCOUNTER — Other Ambulatory Visit: Payer: Self-pay | Admitting: Urology

## 2020-09-22 DIAGNOSIS — N5201 Erectile dysfunction due to arterial insufficiency: Secondary | ICD-10-CM | POA: Diagnosis not present

## 2020-09-22 DIAGNOSIS — N201 Calculus of ureter: Secondary | ICD-10-CM

## 2020-09-22 DIAGNOSIS — N202 Calculus of kidney with calculus of ureter: Secondary | ICD-10-CM | POA: Diagnosis not present

## 2020-09-22 DIAGNOSIS — Z01818 Encounter for other preprocedural examination: Secondary | ICD-10-CM | POA: Diagnosis not present

## 2020-09-22 DIAGNOSIS — Z20822 Contact with and (suspected) exposure to covid-19: Secondary | ICD-10-CM | POA: Diagnosis not present

## 2020-09-22 LAB — SARS CORONAVIRUS 2 (TAT 6-24 HRS): SARS Coronavirus 2: NEGATIVE

## 2020-09-22 NOTE — Progress Notes (Signed)
Unable to reach patient. Left message for [facility] to call [facility]

## 2020-09-22 NOTE — Progress Notes (Signed)
Patient to arrive at 0800 on 09/24/2020. History and medications reviewed. Pre-procedure instructions given. NPO after MN except for clear liquids until 0600. Cardizem and depakote  with sip of water morning of procedure. Patient reports taking his ASA 81 mg dose on Monday 09/21/2020 but will not take his dose today. Driver secured.

## 2020-09-24 ENCOUNTER — Ambulatory Visit (HOSPITAL_BASED_OUTPATIENT_CLINIC_OR_DEPARTMENT_OTHER)
Admission: RE | Admit: 2020-09-24 | Discharge: 2020-09-24 | Disposition: A | Discharge status: Home / Self Care | Payer: PPO | Attending: Urology | Admitting: Urology

## 2020-09-24 ENCOUNTER — Encounter (HOSPITAL_BASED_OUTPATIENT_CLINIC_OR_DEPARTMENT_OTHER): Payer: Self-pay | Admitting: Urology

## 2020-09-24 ENCOUNTER — Other Ambulatory Visit: Payer: Self-pay

## 2020-09-24 ENCOUNTER — Encounter (HOSPITAL_BASED_OUTPATIENT_CLINIC_OR_DEPARTMENT_OTHER)
Admission: RE | Disposition: A | Discharge status: Home / Self Care | Payer: Self-pay | Source: Home / Self Care | Attending: Urology

## 2020-09-24 ENCOUNTER — Ambulatory Visit (HOSPITAL_COMMUNITY): Payer: PPO

## 2020-09-24 DIAGNOSIS — Z886 Allergy status to analgesic agent status: Secondary | ICD-10-CM | POA: Diagnosis not present

## 2020-09-24 DIAGNOSIS — N201 Calculus of ureter: Secondary | ICD-10-CM | POA: Diagnosis not present

## 2020-09-24 DIAGNOSIS — Z885 Allergy status to narcotic agent status: Secondary | ICD-10-CM | POA: Insufficient documentation

## 2020-09-24 DIAGNOSIS — Z87442 Personal history of urinary calculi: Secondary | ICD-10-CM | POA: Diagnosis not present

## 2020-09-24 DIAGNOSIS — E119 Type 2 diabetes mellitus without complications: Secondary | ICD-10-CM | POA: Insufficient documentation

## 2020-09-24 DIAGNOSIS — N200 Calculus of kidney: Secondary | ICD-10-CM | POA: Diagnosis not present

## 2020-09-24 DIAGNOSIS — N202 Calculus of kidney with calculus of ureter: Secondary | ICD-10-CM | POA: Diagnosis not present

## 2020-09-24 DIAGNOSIS — G40909 Epilepsy, unspecified, not intractable, without status epilepticus: Secondary | ICD-10-CM | POA: Insufficient documentation

## 2020-09-24 DIAGNOSIS — N210 Calculus in bladder: Secondary | ICD-10-CM | POA: Diagnosis not present

## 2020-09-24 HISTORY — DX: Cardiac arrhythmia, unspecified: I49.9

## 2020-09-24 HISTORY — PX: EXTRACORPOREAL SHOCK WAVE LITHOTRIPSY: SHX1557

## 2020-09-24 LAB — GLUCOSE, CAPILLARY: Glucose-Capillary: 146 mg/dL — ABNORMAL HIGH (ref 70–99)

## 2020-09-24 SURGERY — LITHOTRIPSY, ESWL
Anesthesia: LOCAL | Laterality: Right

## 2020-09-24 MED ORDER — CIPROFLOXACIN HCL 500 MG PO TABS
500.0000 mg | ORAL_TABLET | ORAL | Status: AC
  Administered 2020-09-24: 500 mg via ORAL

## 2020-09-24 MED ORDER — DIPHENHYDRAMINE HCL 25 MG PO CAPS
25.0000 mg | ORAL_CAPSULE | ORAL | Status: AC
  Administered 2020-09-24: 25 mg via ORAL

## 2020-09-24 MED ORDER — SODIUM CHLORIDE 0.9 % IV SOLN: INTRAVENOUS | Status: DC

## 2020-09-24 MED ORDER — DIAZEPAM 5 MG PO TABS
ORAL_TABLET | ORAL | Status: AC
  Filled 2020-09-24: qty 2

## 2020-09-24 MED ORDER — DIAZEPAM 5 MG PO TABS
10.0000 mg | ORAL_TABLET | ORAL | Status: AC
  Administered 2020-09-24: 10 mg via ORAL

## 2020-09-24 MED ORDER — CIPROFLOXACIN HCL 500 MG PO TABS
ORAL_TABLET | ORAL | Status: AC
  Filled 2020-09-24: qty 1

## 2020-09-24 MED ORDER — HYDROCODONE-ACETAMINOPHEN 5-325 MG PO TABS: 1.0000 | ORAL_TABLET | ORAL | 0 refills | Status: AC | PRN

## 2020-09-24 MED ORDER — DIPHENHYDRAMINE HCL 25 MG PO CAPS
ORAL_CAPSULE | ORAL | Status: AC
  Filled 2020-09-24: qty 1

## 2020-09-24 NOTE — Op Note (Signed)
See [facility] OP note scanned into chart. 

## 2020-09-24 NOTE — Discharge Instructions (Signed)
Lithotripsy, Care After This sheet gives you information about how to care for yourself after your procedure. Your health care provider may also give you more specific instructions. If you have problems or questions, contact your health care provider. What can I expect after the procedure? After the procedure, it is common to have:  Some blood in your urine. This should only last for a few days.  Soreness in your back, sides, or upper abdomen for a few days.  Blotches or bruises on your back where the pressure wave entered the skin.  Pain, discomfort, or nausea when pieces (fragments) of the kidney stone move through the tube that carries urine from the kidney to the bladder (ureter). Stone fragments may pass soon after the procedure, but they may continue to pass for up to 4-8 weeks. ? If you have severe pain or nausea, contact your health care provider. This may be caused by a large stone that was not broken up, and this may mean that you need more treatment.  Some pain or discomfort during urination.  Some pain or discomfort in the lower abdomen or (in men) at the base of the penis. Follow these instructions at home: Medicines  Take over-the-counter and prescription medicines only as told by your health care provider.  If you were prescribed an antibiotic medicine, take it as told by your health care provider. Do not stop taking the antibiotic even if you start to feel better.  Do not drive for 24 hours if you were given a medicine to help you relax (sedative).  Do not drive or use heavy machinery while taking prescription pain medicine. Eating and drinking      Drink enough water and fluids to keep your urine clear or pale yellow. This helps any remaining pieces of the stone to pass. It can also help prevent new stones from forming.  Eat plenty of fresh fruits and vegetables.  Follow instructions from your health care provider about eating and drinking restrictions. You may be  instructed: ? To reduce how much salt (sodium) you eat or drink. Check ingredients and nutrition facts on packaged foods and beverages. ? To reduce how much meat you eat.  Eat the recommended amount of calcium for your age and gender. Ask your health care provider how much calcium you should have. General instructions  Get plenty of rest.  Most people can resume normal activities 1-2 days after the procedure. Ask your health care provider what activities are safe for you.  Your health care provider may direct you to lie in a certain position (postural drainage) and tap firmly (percuss) over your kidney area to help stone fragments pass. Follow instructions as told by your health care provider.  If directed, strain all urine through the strainer that was provided by your health care provider. ? Keep all fragments for your health care provider to see. Any stones that are found may be sent to a medical lab for examination. The stone may be as small as a grain of salt.  Keep all follow-up visits as told by your health care provider. This is important. Contact a health care provider if:  You have pain that is severe or does not get better with medicine.  You have nausea that is severe or does not go away.  You have blood in your urine longer than your health care provider told you to expect.  You have more blood in your urine.  You have pain during urination that does   not go away.  You urinate more frequently than usual and this does not go away.  You develop a rash or any other possible signs of an allergic reaction. Get help right away if:  You have severe pain in your back, sides, or upper abdomen.  You have severe pain while urinating.  Your urine is very dark red.  You have blood in your stool (feces).  You cannot pass any urine at all.  You feel a strong urge to urinate after emptying your bladder.  You have a fever or chills.  You develop shortness of breath,  difficulty breathing, or chest pain.  You have severe nausea that leads to persistent vomiting.  You faint. Summary  After this procedure, it is common to have some pain, discomfort, or nausea when pieces (fragments) of the kidney stone move through the tube that carries urine from the kidney to the bladder (ureter). If this pain or nausea is severe, however, you should contact your health care provider.  Most people can resume normal activities 1-2 days after the procedure. Ask your health care provider what activities are safe for you.  Drink enough water and fluids to keep your urine clear or pale yellow. This helps any remaining pieces of the stone to pass, and it can help prevent new stones from forming.  If directed, strain your urine and keep all fragments for your health care provider to see. Fragments or stones may be as small as a grain of salt.  Get help right away if you have severe pain in your back, sides, or upper abdomen or have severe pain while urinating. This information is not intended to replace advice given to you by your health care provider. Make sure you discuss any questions you have with your health care provider. Document Revised: 12/17/2018 Document Reviewed: 07/27/2016 Elsevier Patient Education  2020 [employer] See [facility] discharge instructions in chart. 

## 2020-09-24 NOTE — H&P (Addendum)
H&P  Chief Complaint: Kidney stone  History of Present Illness: 60 yo male presents for ESL for mgmt of a rt ureteral stone.  Past Medical History:  Diagnosis Date  . Arthritis   . Diabetes mellitus without complication (HCC)   . GERD (gastroesophageal reflux disease)    very rare  . Hepatitis 2009  . History of kidney stones     Past Surgical History:  Procedure Laterality Date  . HERNIA REPAIR  at 50 months old  . KIDNEY STONE SURGERY     a few times  . LITHOTRIPSY    . NEPHROLITHOTOMY Left 06/28/2013   Procedure: NEPHROLITHOTOMY LEFT PERCUTANEOUS;  Surgeon: Crist Fat, MD;  Location: [facility];  Service: Urology;  Laterality: Left;    Home Medications:    Allergies:  Allergies  Allergen Reactions  . Januvia [Sitagliptin] Nausea Only    Stomach cramps  . Tylenol [Acetaminophen]     Liver issues  . Pregabalin Other (See Comments)    Felt funny Felt funny   . Tramadol Itching    No family history on file.  Social History:  reports that he has been smoking cigars. He has smoked for the past 30.00 years. He has never used smokeless tobacco. He reports that he does not drink alcohol and does not use drugs.  ROS: A complete review of systems was performed.  All systems are negative except for pertinent findings as noted.  Physical Exam:  Vital signs in last 24 hours: There were no vitals taken for this visit. Constitutional:  Alert and oriented, No acute distress Cardiovascular: Regular rate  Respiratory: Normal respiratory effort GI: Abdomen is soft, nontender, nondistended, no abdominal masses. No CVAT.  Genitourinary: Normal male phallus, testes are descended bilaterally and non-tender and without masses, scrotum is normal in appearance without lesions or masses, perineum is normal on inspection. Lymphatic: No lymphadenopathy Neurologic: Grossly intact, no focal deficits Psychiatric: Normal mood and affect  Laboratory Data:  No results for input(s):  WBC, HGB, HCT, PLT in the last 72 hours.  No results for input(s): NA, K, CL, GLUCOSE, BUN, CALCIUM, CREATININE in the last 72 hours.  Invalid input(s): CO3   No results found for this or any previous visit (from the past 24 hour(s)). Recent Results (from the past 240 hour(s))  SARS CORONAVIRUS 2 (TAT 6-24 HRS) Nasopharyngeal Nasopharyngeal Swab     Status: None   Collection Time: 09/22/20  2:32 PM   Specimen: Nasopharyngeal Swab  Result Value Ref Range Status   SARS Coronavirus 2 NEGATIVE NEGATIVE Final    Comment: (NOTE) SARS-CoV-2 target nucleic acids are NOT DETECTED.  The SARS-CoV-2 RNA is generally detectable in upper and lower respiratory specimens during the acute phase of infection. Negative results do not preclude SARS-CoV-2 infection, do not rule out co-infections with other pathogens, and should not be used as the sole basis for treatment or other patient management decisions. Negative results must be combined with clinical observations, patient history, and epidemiological information. The expected result is Negative.  Fact Sheet for Patients: HairSlick.no  Fact Sheet for Healthcare Providers: quierodirigir.com  This test is not yet approved or cleared by the [place] FDA and  has been authorized for detection and/or diagnosis of SARS-CoV-2 by FDA under an Emergency Use Authorization (EUA). This EUA will remain  in effect (meaning this test can be used) for the duration of the COVID-19 declaration under Se ction 564(b)(1) of the Act, 21 U.S.C. section 360bbb-3(b)(1), unless the authorization is  terminated or revoked sooner.  Performed at [facility], [address], [place], [place] [address]     Renal Function: No results for input(s): CREATININE in the last 168 hours. CrCl cannot be calculated (Patient's most recent lab result is older than the maximum 21 days allowed.).  Radiologic  Imaging: No results found.  Impression/Assessment:  Rt ureteral stone  Plan:  ESL. This is the initial procedure in a possible staged treatment.  Add: ON KUB, stone was on rt side of pelvis, possibly distal ureter. On fluor before procedure, stone is in midline, probably in bladder. I discussed this w/ pt as well as the fact that thhe stone may have been in the bladder long-term. We discussed no Rx vs litho, as this may halp a larger stone pass from the bladder. Other option is to do cystolitholapaxy @ a later date. The pt elects to proceed w/ ESL today.

## 2020-09-25 ENCOUNTER — Encounter (HOSPITAL_BASED_OUTPATIENT_CLINIC_OR_DEPARTMENT_OTHER): Payer: Self-pay | Admitting: Urology

## 2020-10-08 DIAGNOSIS — N200 Calculus of kidney: Secondary | ICD-10-CM | POA: Diagnosis not present

## 2020-10-14 DIAGNOSIS — N200 Calculus of kidney: Secondary | ICD-10-CM | POA: Diagnosis not present

## 2020-10-15 DIAGNOSIS — N200 Calculus of kidney: Secondary | ICD-10-CM | POA: Diagnosis not present

## 2020-10-15 DIAGNOSIS — I10 Essential (primary) hypertension: Secondary | ICD-10-CM | POA: Diagnosis not present

## 2020-11-02 DIAGNOSIS — Z8781 Personal history of (healed) traumatic fracture: Secondary | ICD-10-CM | POA: Diagnosis not present

## 2020-11-02 DIAGNOSIS — B0229 Other postherpetic nervous system involvement: Secondary | ICD-10-CM | POA: Diagnosis not present

## 2020-11-02 DIAGNOSIS — M545 Low back pain, unspecified: Secondary | ICD-10-CM | POA: Diagnosis not present

## 2020-11-02 DIAGNOSIS — S32020D Wedge compression fracture of second lumbar vertebra, subsequent encounter for fracture with routine healing: Secondary | ICD-10-CM | POA: Insufficient documentation

## 2020-11-02 DIAGNOSIS — R404 Transient alteration of awareness: Secondary | ICD-10-CM | POA: Diagnosis not present

## 2020-11-02 DIAGNOSIS — R253 Fasciculation: Secondary | ICD-10-CM | POA: Diagnosis not present

## 2020-11-15 DIAGNOSIS — M545 Low back pain, unspecified: Secondary | ICD-10-CM | POA: Diagnosis not present

## 2020-11-15 DIAGNOSIS — S32020D Wedge compression fracture of second lumbar vertebra, subsequent encounter for fracture with routine healing: Secondary | ICD-10-CM | POA: Diagnosis not present

## 2020-11-15 DIAGNOSIS — G5712 Meralgia paresthetica, left lower limb: Secondary | ICD-10-CM | POA: Diagnosis not present

## 2020-11-16 DIAGNOSIS — N201 Calculus of ureter: Secondary | ICD-10-CM | POA: Diagnosis not present

## 2020-11-16 DIAGNOSIS — R259 Unspecified abnormal involuntary movements: Secondary | ICD-10-CM | POA: Diagnosis not present

## 2020-11-16 DIAGNOSIS — S32020D Wedge compression fracture of second lumbar vertebra, subsequent encounter for fracture with routine healing: Secondary | ICD-10-CM | POA: Diagnosis not present

## 2020-11-16 DIAGNOSIS — B0229 Other postherpetic nervous system involvement: Secondary | ICD-10-CM | POA: Diagnosis not present

## 2020-11-16 DIAGNOSIS — R404 Transient alteration of awareness: Secondary | ICD-10-CM | POA: Diagnosis not present

## 2020-11-18 DIAGNOSIS — Z961 Presence of intraocular lens: Secondary | ICD-10-CM | POA: Diagnosis not present

## 2020-11-18 DIAGNOSIS — Z9841 Cataract extraction status, right eye: Secondary | ICD-10-CM | POA: Diagnosis not present

## 2020-11-18 DIAGNOSIS — H5030 Unspecified intermittent heterotropia: Secondary | ICD-10-CM | POA: Diagnosis not present

## 2020-11-18 DIAGNOSIS — H524 Presbyopia: Secondary | ICD-10-CM | POA: Diagnosis not present

## 2020-11-18 DIAGNOSIS — Z9842 Cataract extraction status, left eye: Secondary | ICD-10-CM | POA: Diagnosis not present

## 2020-11-18 DIAGNOSIS — H5010 Unspecified exotropia: Secondary | ICD-10-CM | POA: Diagnosis not present

## 2020-11-18 DIAGNOSIS — H5042 Monofixation syndrome: Secondary | ICD-10-CM | POA: Diagnosis not present

## 2020-12-03 DIAGNOSIS — Z9889 Other specified postprocedural states: Secondary | ICD-10-CM | POA: Diagnosis not present

## 2020-12-07 DIAGNOSIS — F330 Major depressive disorder, recurrent, mild: Secondary | ICD-10-CM | POA: Diagnosis not present

## 2020-12-07 DIAGNOSIS — F5101 Primary insomnia: Secondary | ICD-10-CM | POA: Diagnosis not present

## 2020-12-07 DIAGNOSIS — F411 Generalized anxiety disorder: Secondary | ICD-10-CM | POA: Diagnosis not present

## 2020-12-29 DIAGNOSIS — N200 Calculus of kidney: Secondary | ICD-10-CM | POA: Diagnosis not present

## 2021-01-12 DIAGNOSIS — N200 Calculus of kidney: Secondary | ICD-10-CM | POA: Diagnosis not present

## 2021-03-01 DIAGNOSIS — I10 Essential (primary) hypertension: Secondary | ICD-10-CM | POA: Diagnosis not present

## 2021-03-01 DIAGNOSIS — E11649 Type 2 diabetes mellitus with hypoglycemia without coma: Secondary | ICD-10-CM | POA: Diagnosis not present

## 2021-03-01 DIAGNOSIS — R5383 Other fatigue: Secondary | ICD-10-CM | POA: Diagnosis not present

## 2021-03-01 DIAGNOSIS — E7800 Pure hypercholesterolemia, unspecified: Secondary | ICD-10-CM | POA: Diagnosis not present

## 2021-03-04 DIAGNOSIS — E119 Type 2 diabetes mellitus without complications: Secondary | ICD-10-CM | POA: Diagnosis not present

## 2021-03-04 DIAGNOSIS — N522 Drug-induced erectile dysfunction: Secondary | ICD-10-CM | POA: Diagnosis not present

## 2021-03-04 DIAGNOSIS — E11649 Type 2 diabetes mellitus with hypoglycemia without coma: Secondary | ICD-10-CM | POA: Diagnosis not present

## 2021-03-04 DIAGNOSIS — E7800 Pure hypercholesterolemia, unspecified: Secondary | ICD-10-CM | POA: Diagnosis not present

## 2021-03-06 DIAGNOSIS — Z4509 Encounter for adjustment and management of other cardiac device: Secondary | ICD-10-CM | POA: Diagnosis not present

## 2021-05-03 DIAGNOSIS — S32020D Wedge compression fracture of second lumbar vertebra, subsequent encounter for fracture with routine healing: Secondary | ICD-10-CM | POA: Diagnosis not present

## 2021-05-03 DIAGNOSIS — G259 Extrapyramidal and movement disorder, unspecified: Secondary | ICD-10-CM | POA: Diagnosis not present

## 2021-05-04 DIAGNOSIS — Z7984 Long term (current) use of oral hypoglycemic drugs: Secondary | ICD-10-CM | POA: Diagnosis not present

## 2021-05-04 DIAGNOSIS — Z794 Long term (current) use of insulin: Secondary | ICD-10-CM | POA: Diagnosis not present

## 2021-05-04 DIAGNOSIS — Z95818 Presence of other cardiac implants and grafts: Secondary | ICD-10-CM | POA: Diagnosis not present

## 2021-05-04 DIAGNOSIS — E119 Type 2 diabetes mellitus without complications: Secondary | ICD-10-CM | POA: Diagnosis not present

## 2021-05-04 DIAGNOSIS — I471 Supraventricular tachycardia: Secondary | ICD-10-CM | POA: Diagnosis not present

## 2021-05-04 DIAGNOSIS — E782 Mixed hyperlipidemia: Secondary | ICD-10-CM | POA: Diagnosis not present

## 2021-05-04 DIAGNOSIS — I10 Essential (primary) hypertension: Secondary | ICD-10-CM | POA: Diagnosis not present

## 2021-06-04 DIAGNOSIS — I471 Supraventricular tachycardia: Secondary | ICD-10-CM | POA: Diagnosis not present

## 2021-06-04 DIAGNOSIS — I4891 Unspecified atrial fibrillation: Secondary | ICD-10-CM | POA: Diagnosis not present

## 2021-06-04 DIAGNOSIS — Z4509 Encounter for adjustment and management of other cardiac device: Secondary | ICD-10-CM | POA: Diagnosis not present

## 2021-06-04 IMAGING — DX DG ABDOMEN 1V
2 series · 2 of 2 positions shown · non-contrast
Comparison: 09/22/2020

CLINICAL DATA: Follow-up right ureteral stone

EXAM:
ABDOMEN - 1 VIEW

[abdomen kub (1 of 2)]
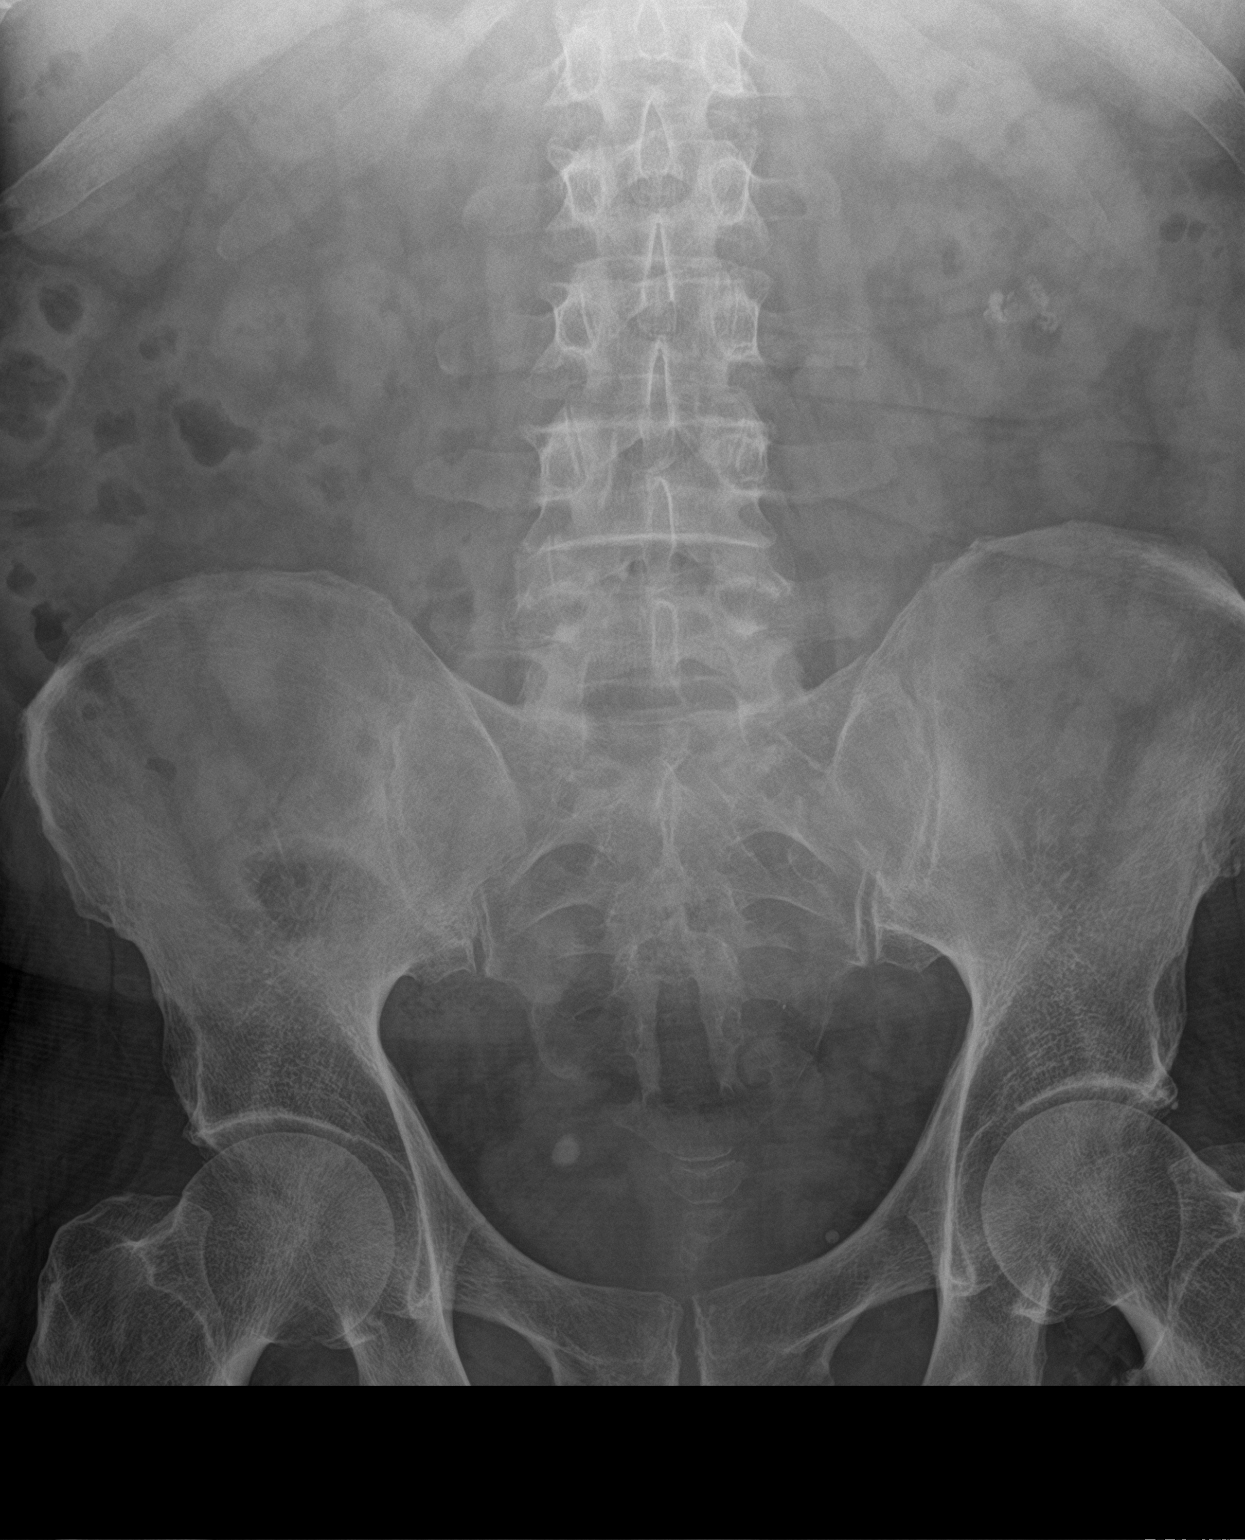

[abdomen kub (2 of 2)]
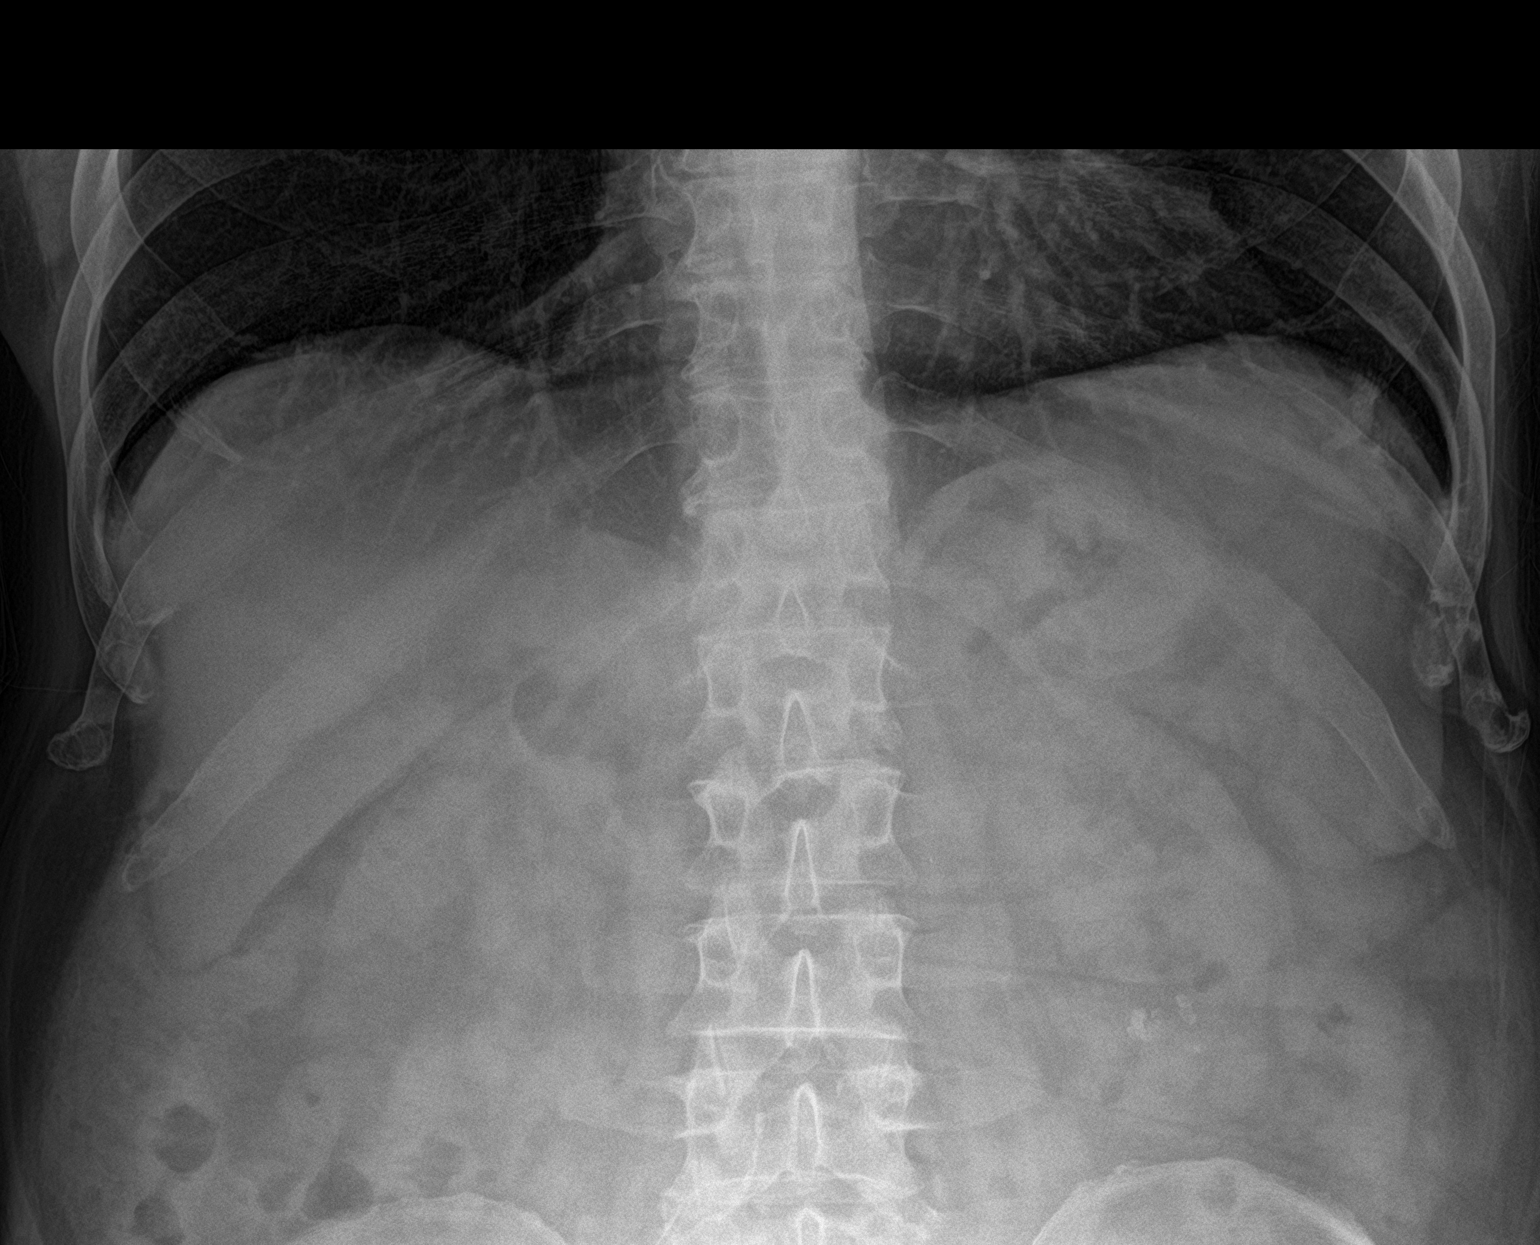

[2 of 2 positions shown; findings below may reference images not displayed]

FINDINGS: Multiple lower pole left renal stones are again identified and
stable. The largest of these measures approximately 6 mm.
Calcification is noted in the right hemipelvis stable in appearance
from the prior exam but new from prior exams from 7412 consistent
with the given clinical history of distal right ureteral stone. No
other right-sided calculi are noted.
IMPRESSION: Stable distal right ureteral stone.

Multiple lower pole left renal calculi as described.

## 2021-06-09 DIAGNOSIS — F330 Major depressive disorder, recurrent, mild: Secondary | ICD-10-CM | POA: Diagnosis not present

## 2021-06-09 DIAGNOSIS — F5101 Primary insomnia: Secondary | ICD-10-CM | POA: Diagnosis not present

## 2021-06-09 DIAGNOSIS — F411 Generalized anxiety disorder: Secondary | ICD-10-CM | POA: Diagnosis not present

## 2021-08-31 DIAGNOSIS — E119 Type 2 diabetes mellitus without complications: Secondary | ICD-10-CM | POA: Diagnosis not present

## 2021-08-31 DIAGNOSIS — I10 Essential (primary) hypertension: Secondary | ICD-10-CM | POA: Diagnosis not present

## 2021-08-31 DIAGNOSIS — E782 Mixed hyperlipidemia: Secondary | ICD-10-CM | POA: Diagnosis not present

## 2021-09-01 DIAGNOSIS — E11649 Type 2 diabetes mellitus with hypoglycemia without coma: Secondary | ICD-10-CM | POA: Diagnosis not present

## 2021-09-01 DIAGNOSIS — E7800 Pure hypercholesterolemia, unspecified: Secondary | ICD-10-CM | POA: Diagnosis not present

## 2021-12-07 DIAGNOSIS — F411 Generalized anxiety disorder: Secondary | ICD-10-CM | POA: Insufficient documentation

## 2023-07-05 DIAGNOSIS — E1165 Type 2 diabetes mellitus with hyperglycemia: Secondary | ICD-10-CM | POA: Insufficient documentation

## 2023-07-05 DIAGNOSIS — E782 Mixed hyperlipidemia: Secondary | ICD-10-CM | POA: Insufficient documentation

## 2023-09-25 ENCOUNTER — Encounter: Payer: Self-pay | Admitting: Podiatry

## 2023-09-25 ENCOUNTER — Ambulatory Visit (INDEPENDENT_AMBULATORY_CARE_PROVIDER_SITE_OTHER): Payer: PPO | Admitting: Podiatry

## 2023-09-25 DIAGNOSIS — B351 Tinea unguium: Secondary | ICD-10-CM | POA: Diagnosis not present

## 2023-09-25 DIAGNOSIS — L603 Nail dystrophy: Secondary | ICD-10-CM

## 2023-09-25 DIAGNOSIS — M79674 Pain in right toe(s): Secondary | ICD-10-CM

## 2023-09-25 NOTE — Progress Notes (Signed)
      HPI: 63 y.o. male presents today with concern of a possible fungus in the right great toenail.  He states he took some oral medication in the past which showed some improvement, but his insurance would not cover the complete course of medication so he had to stop sooner than indicated.  He states the fungal involvement came back.  He is interested in trying to treat the nail fungus again.  Past Medical History:  Diagnosis Date   Arthritis    Diabetes mellitus without complication (HCC)    Dysrhythmia    tachycardia   Essential hypertension 01/28/2020   GERD (gastroesophageal reflux disease)    very rare   Hepatitis 2009   History of kidney stones     Past Surgical History:  Procedure Laterality Date   EXTRACORPOREAL SHOCK WAVE LITHOTRIPSY Right 09/24/2020   Procedure: EXTRACORPOREAL SHOCK WAVE LITHOTRIPSY (ESWL);  Surgeon: Matilda Senior, MD;  Location: [facility];  Service: Urology;  Laterality: Right;   HERNIA REPAIR  at 23 months old   KIDNEY STONE SURGERY     a few times   LITHOTRIPSY     NEPHROLITHOTOMY Left 06/28/2013   Procedure: NEPHROLITHOTOMY LEFT PERCUTANEOUS;  Surgeon: Morene LELON Salines, MD;  Location: [facility];  Service: Urology;  Laterality: Left;    Allergies  Allergen Reactions   Januvia [Sitagliptin] Nausea Only    Stomach cramps   Tylenol  [Acetaminophen ]     Liver issues   Pregabalin Other (See Comments)    Felt funny Felt funny    Tramadol Itching     Physical Exam: There are palpable pedal pulse to the right foot.  The right hallux nail is yellow, 3 to 4 mm thick, with subungual debris, distal onycholysis, and pain with compression.  The remainder of the nails on the right foot are within normal limits.  Assessment/Plan of Care: 1. Nail dystrophy   2. Dermatophytosis of nail   3. Pain in right toe(s)    Discussed clinical findings with patient today.  With the patient's consent clippings of the right hallux nail were  obtained and sent to [facility] for a fungal culture.  Some insurances do require the positive fungal culture prior to approving some topical and/or oral medications.  Also, this way we can tailor the medication specifically to the causative organism(s).  Once the clippings were obtained the remainder of the hallux nail was debrided to decrease girth and length to provide more comfort for him.  Patient was informed it may take 3 to 4 weeks to receive the fungal culture results back.  Will reach out to the patient and discussed treatment options at that time.  Will roughly plan for follow-up in 4 months for fungal nail recheck   Chelcee Korpi CHARM [place], DPM, FACFAS [facility]     [address] [place], [place] [address]                Office [phone]  Fax [phone]

## 2023-09-29 ENCOUNTER — Other Ambulatory Visit: Payer: Self-pay | Admitting: Podiatry

## 2023-09-30 MED ORDER — TERBINAFINE HCL 250 MG PO TABS: 250.0000 mg | ORAL_TABLET | Freq: Every day | ORAL | 0 refills | Status: AC

## 2023-09-30 NOTE — Addendum Note (Signed)
 Addended byLucia Estelle D on: 09/30/2023 05:20 PM   Modules accepted: Orders

## 2023-09-30 NOTE — Progress Notes (Signed)
 Fungal toenail culture was positive for fungus.  Recent labs in October 2024 were normal for liver enzyme function.  Will send in Rx for oral terbinafine  #90 day course.  Patient should get new hepatic panel 6 weeks into therapy since taking a statin drug as well.

## 2024-01-22 ENCOUNTER — Ambulatory Visit (INDEPENDENT_AMBULATORY_CARE_PROVIDER_SITE_OTHER): Payer: PPO | Admitting: Podiatry

## 2024-01-22 DIAGNOSIS — M79674 Pain in right toe(s): Secondary | ICD-10-CM | POA: Diagnosis not present

## 2024-01-22 DIAGNOSIS — B351 Tinea unguium: Secondary | ICD-10-CM

## 2024-01-22 NOTE — Progress Notes (Signed)
     Chief Complaint  Patient presents with   Nail Problem    Nail fungus recheck. Lamisil  taken. IDDM A1C 7.0.   HPI: 63 y.o. male presents today for fungal nail recheck of the right hallux toenail.  He was started on oral terbinafine  return 50 mg 1 tab p.o. daily x 90 days.  Past Medical History:  Diagnosis Date   Arthritis    Diabetes mellitus without complication (HCC)    Dysrhythmia    tachycardia   Essential hypertension 01/28/2020   GERD (gastroesophageal reflux disease)    very rare   Hepatitis 2009   History of kidney stones     Past Surgical History:  Procedure Laterality Date   EXTRACORPOREAL SHOCK WAVE LITHOTRIPSY Right 09/24/2020   Procedure: EXTRACORPOREAL SHOCK WAVE LITHOTRIPSY (ESWL);  Surgeon: Trent Frizzle, MD;  Location: [facility];  Service: Urology;  Laterality: Right;   HERNIA REPAIR  at 57 months old   KIDNEY STONE SURGERY     a few times   LITHOTRIPSY     NEPHROLITHOTOMY Left 06/28/2013   Procedure: NEPHROLITHOTOMY LEFT PERCUTANEOUS;  Surgeon: Andrez Banker, MD;  Location: [facility];  Service: Urology;  Laterality: Left;    Allergies  Allergen Reactions   Januvia [Sitagliptin] Nausea Only    Stomach cramps   Jardiance [Empagliflozin]    Tylenol  [Acetaminophen ]     Liver issues   Pregabalin Other (See Comments)    Felt funny Felt funny    Tramadol Itching    Physical Exam: Palpable pedal pulses noted on the right foot.  The right hallux nail does show approximately 35 to 40% clearing along the proximal nail margin at this time.  No surrounding erythema is noted.  Assessment/Plan of Care: 1. Dermatophytosis of nail   2. Pain in right toe(s)    Discussed clinical findings with patient today.  The fungal right third toenail is debrided to decrease bulk and length.  Patient was able to see the clearing better once this was performed.  Will continue and notes "wait and see" period and monitor the nail growth.  Follow-up in  3 to 4 months   Riannah Stagner D. Gaberial Cada, DPM, FACFAS [facility]     [address] [place], [place] [address]                Office [phone]  Fax [phone]

## 2024-04-23 ENCOUNTER — Ambulatory Visit: Admitting: Podiatry

## 2024-04-29 ENCOUNTER — Ambulatory Visit (INDEPENDENT_AMBULATORY_CARE_PROVIDER_SITE_OTHER): Admitting: Podiatry

## 2024-04-29 ENCOUNTER — Encounter: Payer: Self-pay | Admitting: Podiatry

## 2024-04-29 DIAGNOSIS — M79674 Pain in right toe(s): Secondary | ICD-10-CM | POA: Diagnosis not present

## 2024-04-29 DIAGNOSIS — B351 Tinea unguium: Secondary | ICD-10-CM | POA: Diagnosis not present

## 2024-04-29 NOTE — Progress Notes (Signed)
      Subjective:  Patient ID: Jerome Flores, male    DOB: [birth year],  MRN: [id number]  Jerome Flores presents to clinic today for fungal nail check.  He had taken the 19-month course of oral terbinafine .  He has been off of the oral medication for several months now.  He feels that there is continued improvement of the right great toenail fungus.  PCP is Burnard Jayson RAMAN, MD.  Past Medical History:  Diagnosis Date   Arthritis    Diabetes mellitus without complication (HCC)    Dysrhythmia    tachycardia   Essential hypertension 01/28/2020   GERD (gastroesophageal reflux disease)    very rare   Hepatitis 2009   History of kidney stones    Past Surgical History:  Procedure Laterality Date   EXTRACORPOREAL SHOCK WAVE LITHOTRIPSY Right 09/24/2020   Procedure: EXTRACORPOREAL SHOCK WAVE LITHOTRIPSY (ESWL);  Surgeon: Matilda Senior, MD;  Location: [facility];  Service: Urology;  Laterality: Right;   HERNIA REPAIR  at 9 months old   KIDNEY STONE SURGERY     a few times   LITHOTRIPSY     NEPHROLITHOTOMY Left 06/28/2013   Procedure: NEPHROLITHOTOMY LEFT PERCUTANEOUS;  Surgeon: Morene LELON Salines, MD;  Location: [facility];  Service: Urology;  Laterality: Left;   Allergies  Allergen Reactions   Januvia [Sitagliptin] Nausea Only    Stomach cramps   Jardiance [Empagliflozin]    Tylenol  [Acetaminophen ]     Liver issues   Pregabalin Other (See Comments)    Felt funny Felt funny    Tramadol Itching    Review of Systems: Negative except as noted in the HPI.  Objective:  Vascular Examination: Capillary refill time is 3-5 seconds to toes bilateral. Palpable pedal pulses b/l LE. Digital hair present b/l.    Dermatological Examination: Pedal skin with normal turgor, texture and tone b/l. No open wounds. No interdigital macerations b/l.  The right hallux toenail is 3mm thick, discolored, dystrophic with subungual debris. There is pain with compression of the nail  plate.  There is approximately 75 to 80% improvement of the fungal involvement at this time.  Assessment/Plan: 1. Dermatophytosis of nail   2. Pain in right toe(s)     The mycotic right hallux toenail was sharply debrided  with sterile nail nippers and a power debriding burr to decrease bulk/thickness and length.    Patient to continue with current treatment plan for the fungal nails as there is improvement noted.  Informed patient if there are any signs of recurrence of the fungus, is requested that he call the office and we can place him on a 1 month pulse dose of the oral terbinafine  to kick start some additional improvement until the fungus is completely resolved.  Follow-up in 3 months   Ryatt Corsino D. Cortney Mckinney, DPM, FACFAS [facility]     [address] [place], [place] [address]                Office [phone]  Fax [phone]

## 2024-07-30 ENCOUNTER — Ambulatory Visit: Admitting: Podiatry

## 2024-07-30 DIAGNOSIS — M79674 Pain in right toe(s): Secondary | ICD-10-CM | POA: Diagnosis not present

## 2024-07-30 DIAGNOSIS — B351 Tinea unguium: Secondary | ICD-10-CM

## 2024-07-30 NOTE — Progress Notes (Signed)
      Subjective:  Patient ID: Jerome Flores, male    DOB: 11-Aug-1961,  MRN: [id number]  Jerome Flores presents to clinic today for fungal nail check.  He had completed the oral terbinafine  several months ago.  States that he still feels like there is continued improvement.  The nail is almost completely resolved at this time.  He stated it had been fungal for over 15 years before starting his treatment.  PCP is Burnard Jayson RAMAN, MD (Inactive).  Past Medical History:  Diagnosis Date   Arthritis    Diabetes mellitus without complication (HCC)    Dysrhythmia    tachycardia   Essential hypertension 01/28/2020   GERD (gastroesophageal reflux disease)    very rare   Hepatitis 2009   History of kidney stones    Past Surgical History:  Procedure Laterality Date   EXTRACORPOREAL SHOCK WAVE LITHOTRIPSY Right 09/24/2020   Procedure: EXTRACORPOREAL SHOCK WAVE LITHOTRIPSY (ESWL);  Surgeon: Matilda Senior, MD;  Location: [facility];  Service: Urology;  Laterality: Right;   HERNIA REPAIR  at 98 months old   KIDNEY STONE SURGERY     a few times   LITHOTRIPSY     NEPHROLITHOTOMY Left 06/28/2013   Procedure: NEPHROLITHOTOMY LEFT PERCUTANEOUS;  Surgeon: Morene LELON Salines, MD;  Location: [facility];  Service: Urology;  Laterality: Left;   Allergies  Allergen Reactions   Januvia [Sitagliptin] Nausea Only    Stomach cramps   Jardiance [Empagliflozin]    Tylenol  [Acetaminophen ]     Liver issues   Pregabalin Other (See Comments)    Felt funny Felt funny    Tramadol Itching    Review of Systems: Negative except as noted in the HPI.  Objective:  Vascular Examination: Capillary refill time is 3-5 seconds to toes bilateral. Palpable pedal pulses b/l LE. Digital hair present b/l.    Dermatological Examination: Pedal skin with normal turgor, texture and tone b/l. No open wounds. No interdigital macerations b/l.  The right hallux nail has approximately 90% clear nail growth  at this time.  The distal 10% has some yellow discoloration, subungual debris, distal onycholysis present.  Assessment/Plan: 1. Dermatophytosis of nail   2. Pain in right toe(s)     The mycotic right hallux toenail was sharply debrided with sterile nail nippers and a power debriding burr to decrease bulk/thickness and length.    Informed the patient that we will just continue to wait and observe the new healthy growth of the nail.  It should continue to push out the remaining fungal portion.  It appears approximately 90% resolved at this time.  If he feels that there is any sign of recurrence of the fungus, he can call and we can place him on a 30-day pulsed dose of the oral terbinafine  to get things moving along again.  Otherwise we will follow-up as needed.   Awanda CHARM Imperial, DPM, FACFAS [facility]     [address] [place], [place] [address]                Office [phone]  Fax [phone]
